# Patient Record
Sex: Male | Born: 1955 | Race: Black or African American | Hispanic: No | Marital: Married | Smoking: Former smoker
Health system: Southern US, Community
[De-identification: ages and names within clinical notes are randomized; demographics above are authoritative.]

## PROBLEM LIST (undated history)

## (undated) DIAGNOSIS — M1990 Unspecified osteoarthritis, unspecified site: Secondary | ICD-10-CM

## (undated) DIAGNOSIS — Z8719 Personal history of other diseases of the digestive system: Secondary | ICD-10-CM

## (undated) DIAGNOSIS — M549 Dorsalgia, unspecified: Secondary | ICD-10-CM

## (undated) DIAGNOSIS — K219 Gastro-esophageal reflux disease without esophagitis: Secondary | ICD-10-CM

## (undated) HISTORY — DX: Dorsalgia, unspecified: M54.9

## (undated) HISTORY — PX: PILONIDAL CYST EXCISION: SHX744

---

## 2014-06-02 DIAGNOSIS — M5440 Lumbago with sciatica, unspecified side: Secondary | ICD-10-CM | POA: Insufficient documentation

## 2014-06-02 DIAGNOSIS — G8929 Other chronic pain: Secondary | ICD-10-CM | POA: Insufficient documentation

## 2014-08-11 DIAGNOSIS — M47816 Spondylosis without myelopathy or radiculopathy, lumbar region: Secondary | ICD-10-CM | POA: Insufficient documentation

## 2014-08-11 DIAGNOSIS — M48061 Spinal stenosis, lumbar region without neurogenic claudication: Secondary | ICD-10-CM | POA: Insufficient documentation

## 2014-09-08 DIAGNOSIS — E782 Mixed hyperlipidemia: Secondary | ICD-10-CM | POA: Insufficient documentation

## 2014-09-08 DIAGNOSIS — R7301 Impaired fasting glucose: Secondary | ICD-10-CM | POA: Insufficient documentation

## 2014-09-18 DIAGNOSIS — M4726 Other spondylosis with radiculopathy, lumbar region: Secondary | ICD-10-CM | POA: Insufficient documentation

## 2015-11-22 NOTE — H&P (Signed)
TOTAL HIP ADMISSION H&P  Patient is admitted for right total hip arthroplasty, anterior approach.  Subjective:  Chief Complaint:    Right hip primary OA / pain  HPI: Casey Chase, 60 y.o. male, has a history of pain and functional disability in the right hip(s) due to arthritis and patient has failed non-surgical conservative treatments for greater than 12 weeks to include NSAID's and/or analgesics, corticosteriod injections and activity modification.  Onset of symptoms was gradual starting ~4  years ago with gradually worsening course since that time.The patient noted no past surgery on the right hip(s).  Patient currently rates pain in the right hip at 9 out of 10 with activity. Patient has night pain, worsening of pain with activity and weight bearing, trendelenberg gait, pain that interfers with activities of daily living and pain with passive range of motion. Patient has evidence of periarticular osteophytes and joint space narrowing by imaging studies. This condition presents safety issues increasing the risk of falls.   There is no current active infection.   Risks, benefits and expectations were discussed with the patient.  Risks including but not limited to the risk of anesthesia, blood clots, nerve damage, blood vessel damage, failure of the prosthesis, infection and up to and including death.  Patient understand the risks, benefits and expectations and wishes to proceed with surgery.   PCP: HURDLE, CAITLIN E, NP  D/C Plans:      Home   Post-op Meds:       No Rx given   Tranexamic Acid:      To be given - IV    Decadron:      Is to be given  FYI:     ASA  Norco     Past Medical History:  Diagnosis Date  . Arthritis   . GERD (gastroesophageal reflux disease)   . History of hiatal hernia     Past Surgical History:  Procedure Laterality Date  . PILONIDAL CYST EXCISION      No prescriptions prior to admission.   No Known Allergies   Social History  Substance Use Topics   . Smoking status: Former Games developer  . Smokeless tobacco: Never Used  . Alcohol use 7.2 oz/week    12 Cans of beer per week       Review of Systems  Constitutional: Negative.   HENT: Negative.   Eyes: Negative.   Respiratory: Negative.   Cardiovascular: Negative.   Gastrointestinal: Positive for heartburn.  Genitourinary: Positive for frequency.  Musculoskeletal: Positive for back pain and joint pain.  Skin: Negative.   Neurological: Negative.   Endo/Heme/Allergies: Negative.   Psychiatric/Behavioral: Negative.     Objective:  Physical Exam  Constitutional: He is oriented to person, place, and time. He appears well-developed.  HENT:  Head: Normocephalic.  Eyes: Pupils are equal, round, and reactive to light.  Neck: Neck supple. No JVD present. No tracheal deviation present. No thyromegaly present.  Cardiovascular: Normal rate, regular rhythm, normal heart sounds and intact distal pulses.   Respiratory: Effort normal and breath sounds normal. No respiratory distress. He has no wheezes.  GI: Soft. There is no tenderness. There is no guarding.  Musculoskeletal:       Right hip: He exhibits decreased range of motion, decreased strength, tenderness and bony tenderness. He exhibits no swelling, no deformity and no laceration.  Lymphadenopathy:    He has no cervical adenopathy.  Neurological: He is alert and oriented to person, place, and time.  Skin: Skin is warm  and dry.  Psychiatric: He has a normal mood and affect.      Imaging Review Plain radiographs demonstrate severe degenerative joint disease of the right hip(s). The bone quality appears to be good for age and reported activity level.  Assessment/Plan:  End stage arthritis, right hip(s)  The patient history, physical examination, clinical judgement of the provider and imaging studies are consistent with end stage degenerative joint disease of the right hip(s) and total hip arthroplasty is deemed medically necessary.  The treatment options including medical management, injection therapy, arthroscopy and arthroplasty were discussed at length. The risks and benefits of total hip arthroplasty were presented and reviewed. The risks due to aseptic loosening, infection, stiffness, dislocation/subluxation,  thromboembolic complications and other imponderables were discussed.  The patient acknowledged the explanation, agreed to proceed with the plan and consent was signed. Patient is being admitted for inpatient treatment for surgery, pain control, PT, OT, prophylactic antibiotics, VTE prophylaxis, progressive ambulation and ADL's and discharge planning.The patient is planning to be discharged home with home health services.      Anastasio AuerbachMatthew S. Mousa Prout   PA-C  12/03/2015, 7:50 AM

## 2015-11-23 NOTE — Patient Instructions (Signed)
Casey Chase  11/23/2015   Your procedure is scheduled on: 12/04/2015    [facility]  [facility]  [facility] to 3rd floor to  Short Stay Center at    1000 AM.  Call this number if you have problems the morning of surgery [phone]   Remember: ONLY 1 PERSON MAY GO WITH YOU TO SHORT STAY TO GET  READY MORNING OF YOUR SURGERY.  Do not eat food or drink liquids :After Midnight.     Take these medicines the morning of surgery with A SIP OF WATER:   DO NOT TAKE ANY DIABETIC MEDICATIONS DAY OF YOUR SURGERY                               You may not have any metal on your body including hair pins and              piercings  Do not wear jewelry,, lotions, powders or perfumes, deodorant                        Men may shave face and neck.   Do not bring valuables to the hospital. [facility] IS NOT             RESPONSIBLE   FOR VALUABLES.  Contacts, dentures or bridgework may not be worn into surgery.  Leave suitcase in the car. After surgery it may be brought to your room.         Special Instructions: N/A              Please read over the following fact sheets you were given: _____________________________________________________________________             [facility] - Preparing for [facility] Before surgery, you can play an important role.  Because skin is not sterile, your skin needs to be as free of germs as possible.  You can reduce the number of germs on your skin by washing with CHG (chlorahexidine gluconate) soap before surgery.  CHG is an antiseptic cleaner which kills germs and bonds with the skin to continue killing germs even after washing. Please DO NOT use if you have an allergy to CHG or antibacterial soaps.  If your skin becomes reddened/irritated stop using the CHG and inform your nurse when you arrive at Short Stay. Do not shave (including legs and underarms) for at least 48 hours prior to the first CHG shower.  You may  shave your face/neck. Please follow these instructions carefully:  1.  Shower with CHG Soap the night before surgery and the  morning of Surgery.  2.  If you choose to wash your hair, wash your hair first as usual with your  normal  shampoo.  3.  After you shampoo, rinse your hair and body thoroughly to remove the  shampoo.                           4.  Use CHG as you would any other liquid soap.  You can apply chg directly  to the skin and wash                       Gently with a scrungie or clean washcloth.  5.  Apply  the CHG Soap to your body ONLY FROM THE NECK DOWN.   Do not use on face/ open                           Wound or open sores. Avoid contact with eyes, ears mouth and genitals (private parts).                       Wash face,  Genitals (private parts) with your normal soap.             6.  Wash thoroughly, paying special attention to the area where your surgery  will be performed.  7.  Thoroughly rinse your body with warm water from the neck down.  8.  DO NOT shower/wash with your normal soap after using and rinsing off  the CHG Soap.                9.  Pat yourself dry with a clean towel.            10.  Wear clean pajamas.            11.  Place clean sheets on your bed the night of your first shower and do not  sleep with pets. Day of Surgery : Do not apply any lotions/deodorants the morning of surgery.  Please wear clean clothes to the hospital/surgery center.  FAILURE TO FOLLOW THESE INSTRUCTIONS MAY RESULT IN THE CANCELLATION OF YOUR SURGERY PATIENT SIGNATURE_________________________________  NURSE SIGNATURE__________________________________  ________________________________________________________________________  WHAT IS A BLOOD TRANSFUSION? Blood Transfusion Information  A transfusion is the replacement of blood or some of its parts. Blood is made up of multiple cells which provide different functions.  Red blood cells carry oxygen and are used for blood loss  replacement.  White blood cells fight against infection.  Platelets control bleeding.  Plasma helps clot blood.  Other blood products are available for specialized needs, such as hemophilia or other clotting disorders. BEFORE THE TRANSFUSION  Who gives blood for transfusions?   Healthy volunteers who are fully evaluated to make sure their blood is safe. This is blood bank blood. Transfusion therapy is the safest it has ever been in the practice of medicine. Before blood is taken from a donor, a complete history is taken to make sure that person has no history of diseases nor engages in risky social behavior (examples are intravenous drug use or sexual activity with multiple partners). The donor's travel history is screened to minimize risk of transmitting infections, such as malaria. The donated blood is tested for signs of infectious diseases, such as HIV and hepatitis. The blood is then tested to be sure it is compatible with you in order to minimize the chance of a transfusion reaction. If you or a relative donates blood, this is often done in anticipation of surgery and is not appropriate for emergency situations. It takes many days to process the donated blood. RISKS AND COMPLICATIONS Although transfusion therapy is very safe and saves many lives, the main dangers of transfusion include:   Getting an infectious disease.  Developing a transfusion reaction. This is an allergic reaction to something in the blood you were given. Every precaution is taken to prevent this. The decision to have a blood transfusion has been considered carefully by your caregiver before blood is given. Blood is not given unless the benefits outweigh the risks. AFTER THE TRANSFUSION  Right after receiving a blood  transfusion, you will usually feel much better and more energetic. This is especially true if your red blood cells have gotten low (anemic). The transfusion raises the level of the red blood cells which  carry oxygen, and this usually causes an energy increase.  The nurse administering the transfusion will monitor you carefully for complications. HOME CARE INSTRUCTIONS  No special instructions are needed after a transfusion. You may find your energy is better. Speak with your caregiver about any limitations on activity for underlying diseases you may have. SEEK MEDICAL CARE IF:   Your condition is not improving after your transfusion.  You develop redness or irritation at the intravenous (IV) site. SEEK IMMEDIATE MEDICAL CARE IF:  Any of the following symptoms occur over the next 12 hours:  Shaking chills.  You have a temperature by mouth above 102 F (38.9 C), not controlled by medicine.  Chest, back, or muscle pain.  People around you feel you are not acting correctly or are confused.  Shortness of breath or difficulty breathing.  Dizziness and fainting.  You get a rash or develop hives.  You have a decrease in urine output.  Your urine turns a dark color or changes to pink, red, or brown. Any of the following symptoms occur over the next 10 days:  You have a temperature by mouth above 102 F (38.9 C), not controlled by medicine.  Shortness of breath.  Weakness after normal activity.  The white part of the eye turns yellow (jaundice).  You have a decrease in the amount of urine or are urinating less often.  Your urine turns a dark color or changes to pink, red, or brown. Document Released: 02/15/2000 Document Revised: 05/12/2011 Document Reviewed: 10/04/2007 ExitCare Patient Information 2014 [place].  _______________________________________________________________________  Incentive Spirometer  An incentive spirometer is a tool that can help keep your lungs clear and active. This tool measures how well you are filling your lungs with each breath. Taking long deep breaths may help reverse or decrease the chance of developing breathing (pulmonary) problems  (especially infection) following:  A long period of time when you are unable to move or be active. BEFORE THE PROCEDURE   If the spirometer includes an indicator to show your best effort, your nurse or respiratory therapist will set it to a desired goal.  If possible, sit up straight or lean slightly forward. Try not to slouch.  Hold the incentive spirometer in an upright position. INSTRUCTIONS FOR USE  1. Sit on the edge of your bed if possible, or sit up as far as you can in bed or on a chair. 2. Hold the incentive spirometer in an upright position. 3. Breathe out normally. 4. Place the mouthpiece in your mouth and seal your lips tightly around it. 5. Breathe in slowly and as deeply as possible, raising the piston or the ball toward the top of the column. 6. Hold your breath for 3-5 seconds or for as long as possible. Allow the piston or ball to fall to the bottom of the column. 7. Remove the mouthpiece from your mouth and breathe out normally. 8. Rest for a few seconds and repeat Steps 1 through 7 at least 10 times every 1-2 hours when you are awake. Take your time and take a few normal breaths between deep breaths. 9. The spirometer may include an indicator to show your best effort. Use the indicator as a goal to work toward during each repetition. 10. After each set of 10 deep  breaths, practice coughing to be sure your lungs are clear. If you have an incision (the cut made at the time of surgery), support your incision when coughing by placing a pillow or rolled up towels firmly against it. Once you are able to get out of bed, walk around indoors and cough well. You may stop using the incentive spirometer when instructed by your caregiver.  RISKS AND COMPLICATIONS  Take your time so you do not get dizzy or light-headed.  If you are in pain, you may need to take or ask for pain medication before doing incentive spirometry. It is harder to take a deep breath if you are having  pain. AFTER USE  Rest and breathe slowly and easily.  It can be helpful to keep track of a log of your progress. Your caregiver can provide you with a simple table to help with this. If you are using the spirometer at home, follow these instructions: [place] IF:   You are having difficultly using the spirometer.  You have trouble using the spirometer as often as instructed.  Your pain medication is not giving enough relief while using the spirometer.  You develop fever of 100.5 F (38.1 C) or higher. SEEK IMMEDIATE MEDICAL CARE IF:   You cough up bloody sputum that had not been present before.  You develop fever of 102 F (38.9 C) or greater.  You develop worsening pain at or near the incision site. MAKE SURE YOU:   Understand these instructions.  Will watch your condition.  Will get help right away if you are not doing well or get worse. Document Released: 06/30/2006 Document Revised: 05/12/2011 Document Reviewed: 08/31/2006 [facility] Patient Information 2014 [place], [place].   ________________________________________________________________________

## 2015-11-27 ENCOUNTER — Encounter (HOSPITAL_COMMUNITY): Payer: Self-pay

## 2015-11-27 ENCOUNTER — Encounter (HOSPITAL_COMMUNITY)
Admission: RE | Admit: 2015-11-27 | Discharge: 2015-11-27 | Disposition: A | Discharge status: Home / Self Care | Payer: BLUE CROSS/BLUE SHIELD | Source: Ambulatory Visit | Attending: Orthopedic Surgery | Admitting: Orthopedic Surgery

## 2015-11-27 DIAGNOSIS — Z01818 Encounter for other preprocedural examination: Secondary | ICD-10-CM | POA: Diagnosis present

## 2015-11-27 HISTORY — DX: Unspecified osteoarthritis, unspecified site: M19.90

## 2015-11-27 HISTORY — DX: Gastro-esophageal reflux disease without esophagitis: K21.9

## 2015-11-27 HISTORY — DX: Personal history of other diseases of the digestive system: Z87.19

## 2015-11-27 LAB — CBC
HCT: 42.4 % (ref 39.0–52.0)
Hemoglobin: 13.8 g/dL (ref 13.0–17.0)
MCH: 29.4 pg (ref 26.0–34.0)
MCHC: 32.5 g/dL (ref 30.0–36.0)
MCV: 90.2 fL (ref 78.0–100.0)
Platelets: 313 10*3/uL (ref 150–400)
RBC: 4.7 MIL/uL (ref 4.22–5.81)
RDW: 14.5 % (ref 11.5–15.5)
WBC: 6 10*3/uL (ref 4.0–10.5)

## 2015-11-27 LAB — ABO/RH: ABO/RH(D): A POS

## 2015-11-27 LAB — SURGICAL PCR SCREEN
MRSA, PCR: NEGATIVE
STAPHYLOCOCCUS AUREUS: NEGATIVE

## 2015-12-04 ENCOUNTER — Encounter (HOSPITAL_COMMUNITY)
Admission: RE | Disposition: A | Discharge status: Home Health | Payer: Self-pay | Source: Ambulatory Visit | Attending: Orthopedic Surgery

## 2015-12-04 ENCOUNTER — Inpatient Hospital Stay (HOSPITAL_COMMUNITY): Payer: BLUE CROSS/BLUE SHIELD

## 2015-12-04 ENCOUNTER — Inpatient Hospital Stay (HOSPITAL_COMMUNITY): Payer: BLUE CROSS/BLUE SHIELD | Admitting: Certified Registered"

## 2015-12-04 ENCOUNTER — Encounter (HOSPITAL_COMMUNITY): Payer: Self-pay | Admitting: *Deleted

## 2015-12-04 ENCOUNTER — Inpatient Hospital Stay (HOSPITAL_COMMUNITY)
Admission: RE | Admit: 2015-12-04 | Discharge: 2015-12-05 | DRG: 470 | Disposition: A | Discharge status: Home Health | Payer: BLUE CROSS/BLUE SHIELD | Source: Ambulatory Visit | Attending: Orthopedic Surgery | Admitting: Orthopedic Surgery

## 2015-12-04 DIAGNOSIS — M1611 Unilateral primary osteoarthritis, right hip: Principal | ICD-10-CM | POA: Diagnosis present

## 2015-12-04 DIAGNOSIS — Z87891 Personal history of nicotine dependence: Secondary | ICD-10-CM | POA: Diagnosis not present

## 2015-12-04 DIAGNOSIS — E669 Obesity, unspecified: Secondary | ICD-10-CM | POA: Diagnosis present

## 2015-12-04 DIAGNOSIS — Z96649 Presence of unspecified artificial hip joint: Secondary | ICD-10-CM

## 2015-12-04 DIAGNOSIS — E663 Overweight: Secondary | ICD-10-CM | POA: Diagnosis present

## 2015-12-04 DIAGNOSIS — M25551 Pain in right hip: Secondary | ICD-10-CM | POA: Diagnosis present

## 2015-12-04 DIAGNOSIS — K219 Gastro-esophageal reflux disease without esophagitis: Secondary | ICD-10-CM | POA: Diagnosis present

## 2015-12-04 DIAGNOSIS — K449 Diaphragmatic hernia without obstruction or gangrene: Secondary | ICD-10-CM | POA: Diagnosis present

## 2015-12-04 DIAGNOSIS — Z6829 Body mass index (BMI) 29.0-29.9, adult: Secondary | ICD-10-CM | POA: Diagnosis not present

## 2015-12-04 HISTORY — PX: TOTAL HIP ARTHROPLASTY: SHX124

## 2015-12-04 LAB — TYPE AND SCREEN
ABO/RH(D): A POS
ANTIBODY SCREEN: NEGATIVE

## 2015-12-04 SURGERY — ARTHROPLASTY, HIP, TOTAL, ANTERIOR APPROACH
Anesthesia: Spinal | Site: Hip | Laterality: Right

## 2015-12-04 MED ORDER — LIDOCAINE 2% (20 MG/ML) 5 ML SYRINGE
INTRAMUSCULAR | Status: AC
  Filled 2015-12-04: qty 5

## 2015-12-04 MED ORDER — DEXAMETHASONE SODIUM PHOSPHATE 10 MG/ML IJ SOLN
INTRAMUSCULAR | Status: DC | PRN
  Administered 2015-12-04: 10 mg via INTRAVENOUS

## 2015-12-04 MED ORDER — DIPHENHYDRAMINE HCL 25 MG PO CAPS: 25.0000 mg | ORAL_CAPSULE | Freq: Four times a day (QID) | ORAL | Status: DC | PRN

## 2015-12-04 MED ORDER — METHOCARBAMOL 500 MG PO TABS
500.0000 mg | ORAL_TABLET | Freq: Four times a day (QID) | ORAL | Status: DC | PRN
  Administered 2015-12-05: 500 mg via ORAL
  Filled 2015-12-04: qty 1

## 2015-12-04 MED ORDER — BISACODYL 10 MG RE SUPP: 10.0000 mg | Freq: Every day | RECTAL | Status: DC | PRN

## 2015-12-04 MED ORDER — BUPIVACAINE IN DEXTROSE 0.75-8.25 % IT SOLN
INTRATHECAL | Status: DC | PRN
  Administered 2015-12-04: 2 mL via INTRATHECAL

## 2015-12-04 MED ORDER — CEFAZOLIN SODIUM-DEXTROSE 2-4 GM/100ML-% IV SOLN
2.0000 g | INTRAVENOUS | Status: AC
  Administered 2015-12-04: 2 g via INTRAVENOUS

## 2015-12-04 MED ORDER — METHOCARBAMOL 1000 MG/10ML IJ SOLN
500.0000 mg | Freq: Four times a day (QID) | INTRAVENOUS | Status: DC | PRN
  Administered 2015-12-04: 500 mg via INTRAVENOUS
  Filled 2015-12-04: qty 5
  Filled 2015-12-04: qty 550

## 2015-12-04 MED ORDER — PROPOFOL 10 MG/ML IV BOLUS
INTRAVENOUS | Status: DC | PRN
  Administered 2015-12-04: 20 mg via INTRAVENOUS

## 2015-12-04 MED ORDER — DOCUSATE SODIUM 100 MG PO CAPS
100.0000 mg | ORAL_CAPSULE | Freq: Two times a day (BID) | ORAL | Status: DC
  Administered 2015-12-04 – 2015-12-05 (×2): 100 mg via ORAL
  Filled 2015-12-04 (×2): qty 1

## 2015-12-04 MED ORDER — DEXAMETHASONE SODIUM PHOSPHATE 10 MG/ML IJ SOLN: 10.0000 mg | Freq: Once | INTRAMUSCULAR | Status: DC

## 2015-12-04 MED ORDER — PHENYLEPHRINE 40 MCG/ML (10ML) SYRINGE FOR IV PUSH (FOR BLOOD PRESSURE SUPPORT)
PREFILLED_SYRINGE | INTRAVENOUS | Status: DC | PRN
  Administered 2015-12-04 (×5): 80 ug via INTRAVENOUS

## 2015-12-04 MED ORDER — METOCLOPRAMIDE HCL 5 MG PO TABS: 5.0000 mg | ORAL_TABLET | Freq: Three times a day (TID) | ORAL | Status: DC | PRN

## 2015-12-04 MED ORDER — PROPOFOL 10 MG/ML IV BOLUS
INTRAVENOUS | Status: AC
  Filled 2015-12-04: qty 20

## 2015-12-04 MED ORDER — PROPOFOL 10 MG/ML IV BOLUS
INTRAVENOUS | Status: AC
  Filled 2015-12-04: qty 40

## 2015-12-04 MED ORDER — CEFAZOLIN SODIUM-DEXTROSE 2-4 GM/100ML-% IV SOLN: 2.0000 g | INTRAVENOUS | Status: DC

## 2015-12-04 MED ORDER — ONDANSETRON HCL 4 MG PO TABS: 4.0000 mg | ORAL_TABLET | Freq: Four times a day (QID) | ORAL | Status: DC | PRN

## 2015-12-04 MED ORDER — MENTHOL 3 MG MT LOZG: 1.0000 | LOZENGE | OROMUCOSAL | Status: DC | PRN

## 2015-12-04 MED ORDER — METOCLOPRAMIDE HCL 5 MG/ML IJ SOLN
5.0000 mg | Freq: Three times a day (TID) | INTRAMUSCULAR | Status: DC | PRN
Start: 2015-12-04 — End: 2015-12-05
  Administered 2015-12-05: 10 mg via INTRAVENOUS
  Filled 2015-12-04: qty 2

## 2015-12-04 MED ORDER — SODIUM CHLORIDE 0.9 % IV SOLN
100.0000 mL/h | INTRAVENOUS | Status: DC
  Administered 2015-12-04: 100 mL/h via INTRAVENOUS
  Filled 2015-12-04 (×4): qty 1000

## 2015-12-04 MED ORDER — ALUM & MAG HYDROXIDE-SIMETH 200-200-20 MG/5ML PO SUSP: 30.0000 mL | ORAL | Status: DC | PRN

## 2015-12-04 MED ORDER — TRANEXAMIC ACID 1000 MG/10ML IV SOLN
1000.0000 mg | INTRAVENOUS | Status: AC
  Administered 2015-12-04: 1000 mg via INTRAVENOUS
  Filled 2015-12-04: qty 10

## 2015-12-04 MED ORDER — ONDANSETRON HCL 4 MG/2ML IJ SOLN
4.0000 mg | Freq: Four times a day (QID) | INTRAMUSCULAR | Status: DC | PRN
  Administered 2015-12-05: 4 mg via INTRAVENOUS
  Filled 2015-12-04: qty 2

## 2015-12-04 MED ORDER — HYDROMORPHONE HCL 1 MG/ML IJ SOLN: 0.2500 mg | INTRAMUSCULAR | Status: DC | PRN

## 2015-12-04 MED ORDER — CEFAZOLIN SODIUM-DEXTROSE 2-4 GM/100ML-% IV SOLN
2.0000 g | Freq: Four times a day (QID) | INTRAVENOUS | Status: AC
  Administered 2015-12-04 – 2015-12-05 (×2): 2 g via INTRAVENOUS
  Filled 2015-12-04 (×2): qty 100

## 2015-12-04 MED ORDER — HYDROMORPHONE HCL 1 MG/ML IJ SOLN
0.5000 mg | INTRAMUSCULAR | Status: DC | PRN
  Administered 2015-12-04: 0.5 mg via INTRAVENOUS
  Filled 2015-12-04: qty 1

## 2015-12-04 MED ORDER — POLYETHYLENE GLYCOL 3350 17 G PO PACK
17.0000 g | PACK | Freq: Two times a day (BID) | ORAL | Status: DC
  Administered 2015-12-04: 17 g via ORAL
  Filled 2015-12-04 (×2): qty 1

## 2015-12-04 MED ORDER — LACTATED RINGERS IV SOLN
INTRAVENOUS | Status: DC
  Administered 2015-12-04 (×2): via INTRAVENOUS

## 2015-12-04 MED ORDER — CELECOXIB 200 MG PO CAPS
200.0000 mg | ORAL_CAPSULE | Freq: Two times a day (BID) | ORAL | Status: DC
  Administered 2015-12-04 – 2015-12-05 (×2): 200 mg via ORAL
  Filled 2015-12-04 (×2): qty 1

## 2015-12-04 MED ORDER — MAGNESIUM CITRATE PO SOLN: 1.0000 | Freq: Once | ORAL | Status: DC | PRN

## 2015-12-04 MED ORDER — HYDROCODONE-ACETAMINOPHEN 7.5-325 MG PO TABS
1.0000 | ORAL_TABLET | ORAL | Status: DC
  Administered 2015-12-04 (×2): 1 via ORAL
  Administered 2015-12-05 (×3): 2 via ORAL
  Administered 2015-12-05: 1 via ORAL
  Filled 2015-12-04: qty 1
  Filled 2015-12-04: qty 2
  Filled 2015-12-04: qty 1
  Filled 2015-12-04: qty 2
  Filled 2015-12-04: qty 1
  Filled 2015-12-04: qty 2

## 2015-12-04 MED ORDER — SODIUM CHLORIDE 0.9 % IR SOLN
Status: DC | PRN
  Administered 2015-12-04: 1000 mL

## 2015-12-04 MED ORDER — CEFAZOLIN SODIUM-DEXTROSE 2-4 GM/100ML-% IV SOLN
INTRAVENOUS | Status: AC
  Filled 2015-12-04: qty 100

## 2015-12-04 MED ORDER — PHENOL 1.4 % MT LIQD
1.0000 | OROMUCOSAL | Status: DC | PRN
  Filled 2015-12-04: qty 177

## 2015-12-04 MED ORDER — ASPIRIN 81 MG PO CHEW
81.0000 mg | CHEWABLE_TABLET | Freq: Two times a day (BID) | ORAL | Status: DC
  Administered 2015-12-04 – 2015-12-05 (×2): 81 mg via ORAL
  Filled 2015-12-04 (×2): qty 1

## 2015-12-04 MED ORDER — EPHEDRINE SULFATE-NACL 50-0.9 MG/10ML-% IV SOSY
PREFILLED_SYRINGE | INTRAVENOUS | Status: DC | PRN
  Administered 2015-12-04: 15 mg via INTRAVENOUS

## 2015-12-04 MED ORDER — FENTANYL CITRATE (PF) 100 MCG/2ML IJ SOLN
INTRAMUSCULAR | Status: DC | PRN
Start: 2015-12-04 — End: 2015-12-04
  Administered 2015-12-04 (×2): 25 ug via INTRAVENOUS
  Administered 2015-12-04: 50 ug via INTRAVENOUS

## 2015-12-04 MED ORDER — MIDAZOLAM HCL 2 MG/2ML IJ SOLN
INTRAMUSCULAR | Status: AC
  Filled 2015-12-04: qty 2

## 2015-12-04 MED ORDER — ONDANSETRON HCL 4 MG/2ML IJ SOLN
INTRAMUSCULAR | Status: DC | PRN
  Administered 2015-12-04: 4 mg via INTRAVENOUS

## 2015-12-04 MED ORDER — PROPOFOL 500 MG/50ML IV EMUL
INTRAVENOUS | Status: DC | PRN
  Administered 2015-12-04: 75 ug/kg/min via INTRAVENOUS

## 2015-12-04 MED ORDER — MIDAZOLAM HCL 5 MG/5ML IJ SOLN
INTRAMUSCULAR | Status: DC | PRN
  Administered 2015-12-04: 2 mg via INTRAVENOUS

## 2015-12-04 MED ORDER — PHENYLEPHRINE 40 MCG/ML (10ML) SYRINGE FOR IV PUSH (FOR BLOOD PRESSURE SUPPORT)
PREFILLED_SYRINGE | INTRAVENOUS | Status: AC
  Filled 2015-12-04: qty 10

## 2015-12-04 MED ORDER — FERROUS SULFATE 325 (65 FE) MG PO TABS: 325.0000 mg | ORAL_TABLET | Freq: Three times a day (TID) | ORAL | Status: DC

## 2015-12-04 MED ORDER — EPHEDRINE 5 MG/ML INJ
INTRAVENOUS | Status: AC
  Filled 2015-12-04: qty 10

## 2015-12-04 MED ORDER — FENTANYL CITRATE (PF) 100 MCG/2ML IJ SOLN
INTRAMUSCULAR | Status: AC
Start: 2015-12-04 — End: 2015-12-04
  Filled 2015-12-04: qty 2

## 2015-12-04 MED ORDER — PROMETHAZINE HCL 25 MG/ML IJ SOLN: 6.2500 mg | INTRAMUSCULAR | Status: DC | PRN

## 2015-12-04 MED ORDER — DEXAMETHASONE SODIUM PHOSPHATE 10 MG/ML IJ SOLN
INTRAMUSCULAR | Status: AC
  Filled 2015-12-04: qty 1

## 2015-12-04 MED ORDER — LIDOCAINE 2% (20 MG/ML) 5 ML SYRINGE
INTRAMUSCULAR | Status: DC | PRN
  Administered 2015-12-04: 20 mg via INTRAVENOUS

## 2015-12-04 MED ORDER — CHLORHEXIDINE GLUCONATE 4 % EX LIQD: 60.0000 mL | Freq: Once | CUTANEOUS | Status: DC

## 2015-12-04 MED ORDER — ONDANSETRON HCL 4 MG/2ML IJ SOLN
INTRAMUSCULAR | Status: AC
  Filled 2015-12-04: qty 2

## 2015-12-04 SURGICAL SUPPLY — 35 items
BAG DECANTER FOR FLEXI CONT (MISCELLANEOUS) IMPLANT
BAG ZIPLOCK 12X15 (MISCELLANEOUS) IMPLANT
CAPT HIP TOTAL 2 ×3 IMPLANT
CLOTH BEACON ORANGE TIMEOUT ST (SAFETY) ×3 IMPLANT
COVER PERINEAL POST (MISCELLANEOUS) ×3 IMPLANT
DERMABOND ADVANCED (GAUZE/BANDAGES/DRESSINGS) ×2
DERMABOND ADVANCED .7 DNX12 (GAUZE/BANDAGES/DRESSINGS) ×1 IMPLANT
DRAPE STERI IOBAN 125X83 (DRAPES) ×3 IMPLANT
DRAPE U-SHAPE 47X51 STRL (DRAPES) ×6 IMPLANT
DRESSING AQUACEL AG SP 3.5X10 (GAUZE/BANDAGES/DRESSINGS) ×1 IMPLANT
DRSG AQUACEL AG SP 3.5X10 (GAUZE/BANDAGES/DRESSINGS) ×3
DURAPREP 26ML APPLICATOR (WOUND CARE) ×3 IMPLANT
ELECT REM PT RETURN 15FT ADLT (MISCELLANEOUS) IMPLANT
ELECT REM PT RETURN 9FT ADLT (ELECTROSURGICAL) ×3
ELECTRODE REM PT RTRN 9FT ADLT (ELECTROSURGICAL) ×1 IMPLANT
GLOVE BIOGEL M STRL SZ7.5 (GLOVE) ×3 IMPLANT
GLOVE BIOGEL PI IND STRL 7.5 (GLOVE) ×1 IMPLANT
GLOVE BIOGEL PI IND STRL 8.5 (GLOVE) IMPLANT
GLOVE BIOGEL PI INDICATOR 7.5 (GLOVE) ×2
GLOVE BIOGEL PI INDICATOR 8.5 (GLOVE)
GLOVE ECLIPSE 8.0 STRL XLNG CF (GLOVE) ×6 IMPLANT
GLOVE ORTHO TXT STRL SZ7.5 (GLOVE) ×6 IMPLANT
GOWN STRL REUS W/TWL LRG LVL3 (GOWN DISPOSABLE) ×3 IMPLANT
GOWN STRL REUS W/TWL XL LVL3 (GOWN DISPOSABLE) ×6 IMPLANT
HOLDER FOLEY CATH W/STRAP (MISCELLANEOUS) ×3 IMPLANT
PACK ANTERIOR HIP CUSTOM (KITS) ×3 IMPLANT
SAW OSC TIP CART 19.5X105X1.3 (SAW) ×3 IMPLANT
SUT MNCRL AB 4-0 PS2 18 (SUTURE) ×3 IMPLANT
SUT VIC AB 1 CT1 36 (SUTURE) ×9 IMPLANT
SUT VIC AB 2-0 CT1 27 (SUTURE) ×4
SUT VIC AB 2-0 CT1 TAPERPNT 27 (SUTURE) ×2 IMPLANT
SUT VLOC 180 0 24IN GS25 (SUTURE) ×3 IMPLANT
TRAY FOLEY W/METER SILVER 16FR (SET/KITS/TRAYS/PACK) ×3 IMPLANT
WATER STERILE IRR 1500ML POUR (IV SOLUTION) ×3 IMPLANT
YANKAUER SUCT BULB TIP 10FT TU (MISCELLANEOUS) ×3 IMPLANT

## 2015-12-04 NOTE — Op Note (Signed)
NAME:  Casey Chase                ACCOUNT NO.: [id number]      MEDICAL RECORD NO.: [id number]      FACILITY:  [facility]      PHYSICIAN:  Durene Romans D  DATE OF BIRTH:  1955-05-28     DATE OF PROCEDURE:  12/04/2015                                 OPERATIVE REPORT         PREOPERATIVE DIAGNOSIS: Right  hip osteoarthritis.      POSTOPERATIVE DIAGNOSIS:  Right hip osteoarthritis.      PROCEDURE:  Right total hip replacement through an anterior approach   utilizing DePuy THR system, component size 52mm pinnacle cup, a size 36+4 neutral   Altrex liner, a size 4 Hi Tri Lock stem with a 36+1.5 delta ceramic   ball.      SURGEON:  Madlyn Frankel. Charlann Boxer, M.D.      ASSISTANT:  Skip Mayer, PA-C     ANESTHESIA:  Spinal.      SPECIMENS:  None.      COMPLICATIONS:  None.      BLOOD LOSS:  300 cc     DRAINS:  None.      INDICATION OF THE PROCEDURE:  Casey Chase is a 60 y.o. male who had   presented to office for evaluation of right hip pain.  Radiographs revealed   progressive degenerative changes with bone-on-bone   articulation to the  hip joint.  The patient had painful limited range of   motion significantly affecting their overall quality of life.  The patient was failing to    respond to conservative measures, and at this point was ready   to proceed with more definitive measures.  The patient has noted progressive   degenerative changes in his hip, progressive problems and dysfunction   with regarding the hip prior to surgery.  Consent was obtained for   benefit of pain relief.  Specific risk of infection, DVT, component   failure, dislocation, need for revision surgery, as well discussion of   the anterior versus posterior approach were reviewed.  Consent was   obtained for benefit of anterior pain relief through an anterior   approach.      PROCEDURE IN DETAIL:  The patient was brought to operative theater.   Once adequate anesthesia, preoperative  antibiotics, 2gm of Ancef, 1 gm of Tranexamic Acid, and 10 mg of Decadron administered.   The patient was positioned supine on the OSI Hanna table.  Once adequate   padding of boney process was carried out, we had predraped out the hip, and  used fluoroscopy to confirm orientation of the pelvis and position.      The right hip was then prepped and draped from proximal iliac crest to   mid thigh with shower curtain technique.      Time-out was performed identifying the patient, planned procedure, and   extremity.     An incision was then made 2 cm distal and lateral to the   anterior superior iliac spine extending over the orientation of the   tensor fascia lata muscle and sharp dissection was carried down to the   fascia of the muscle and protractor placed in the soft tissues.      The fascia was then incised.  The muscle belly was identified and swept   laterally and retractor placed along the superior neck.  Following   cauterization of the circumflex vessels and removing some pericapsular   fat, a second cobra retractor was placed on the inferior neck.  A third   retractor was placed on the anterior acetabulum after elevating the   anterior rectus.  A L-capsulotomy was along the line of the   superior neck to the trochanteric fossa, then extended proximally and   distally.  Tag sutures were placed and the retractors were then placed   intracapsular.  We then identified the trochanteric fossa and   orientation of my neck cut, confirmed this radiographically   and then made a neck osteotomy with the femur on traction.  The femoral   head was removed without difficulty or complication.  Traction was let   off and retractors were placed posterior and anterior around the   acetabulum.      The labrum and foveal tissue were debrided.  I began reaming with a 45mm   reamer and reamed up to 51mm reamer with good bony bed preparation and a 52mm   cup was chosen.  The final 52mm Pinnacle cup  was then impacted under fluoroscopy  to confirm the depth of penetration and orientation with respect to   abduction.  A screw was placed followed by the hole eliminator.  The final   36+4 neutral Altrex liner was impacted with good visualized rim fit.  The cup was positioned anatomically within the acetabular portion of the pelvis.      At this point, the femur was rolled at 80 degrees.  Further capsule was   released off the inferior aspect of the femoral neck.  I then   released the superior capsule proximally.  The hook was placed laterally   along the femur and elevated manually and held in position with the bed   hook.  The leg was then extended and adducted with the leg rolled to 100   degrees of external rotation.  Once the proximal femur was fully   exposed, I used a box osteotome to set orientation.  I then began   broaching with the starting chili pepper broach and passed this by hand and then broached up to 4.  With the 4 broach in place I chose a high offset neck and did several trial reductions.  The offset was appropriate, leg lengths   appeared to be equal, confirmed radiographically.   Given these findings, I went ahead and dislocated the hip, repositioned all   retractors and positioned the right hip in the extended and abducted position.  The final 4 Hi Tri Lock stem was   chosen and it was impacted down to the level of neck cut.  Based on this   and the trial reduction, a 36+1.5 delta ceramic ball was chosen and   impacted onto a clean and dry trunnion, and the hip was reduced.  The   hip had been irrigated throughout the case again at this point.  I did   reapproximate the superior capsular leaflet to the anterior leaflet   using #1 Vicryl.  The fascia of the   tensor fascia lata muscle was then reapproximated using #1 Vicryl and #0 V-lock sutures.  The   remaining wound was closed with 2-0 Vicryl and running 4-0 Monocryl.   The hip was cleaned, dried, and dressed  sterilely using Dermabond and   Aquacel dressing.  He was then brought   to recovery room in stable condition tolerating the procedure well.    Leilani Able, PA-C was present for the entirety of the case involved from   preoperative positioning, perioperative retractor management, general   facilitation of the case, as well as primary wound closure as assistant.            Madlyn Frankel Charlann Boxer, M.D.        12/04/2015 2:03 PM

## 2015-12-04 NOTE — Interval H&P Note (Signed)
History and Physical Interval Note:  12/04/2015 11:10 AM  Casey Chase  has presented today for surgery, with the diagnosis of right hip osteoarthritis  The various methods of treatment have been discussed with the patient and family. After consideration of risks, benefits and other options for treatment, the patient has consented to  Procedure(s): RIGHT TOTAL HIP ARTHROPLASTY ANTERIOR APPROACH (Right) as a surgical intervention .  The patient's history has been reviewed, patient examined, no change in status, stable for surgery.  I have reviewed the patient's chart and labs.  Questions were answered to the patient's satisfaction.     Shelda PalLIN,Dezyre Hoefer D

## 2015-12-04 NOTE — Anesthesia Procedure Notes (Signed)
Spinal  Patient location during procedure: OR Start time: 12/04/2015 12:33 PM End time: 12/04/2015 12:36 PM Staffing Anesthesiologist: Franne Grip Resident/CRNA: Lajuana Carry E Performed: resident/CRNA  Preanesthetic Checklist Completed: patient identified, site marked, surgical consent, pre-op evaluation, timeout performed, IV checked, risks and benefits discussed and monitors and equipment checked Spinal Block Patient position: sitting Prep: Betadine Patient monitoring: heart rate, continuous pulse ox and blood pressure Approach: midline Location: L3-4 Injection technique: single-shot Needle Needle type: Pencan  Needle gauge: 24 G Needle length: 9 cm Additional Notes Kit expiration checked, Clear CSF, neg heme, neg paresthesia first attempt. Tol well.

## 2015-12-04 NOTE — Anesthesia Preprocedure Evaluation (Addendum)
Anesthesia Evaluation  Patient identified by MRN, date of birth, ID band Patient awake    Reviewed: Allergy & Precautions, NPO status , Patient's Chart, lab work & pertinent test results  Airway Mallampati: II  TM Distance: >3 FB Neck ROM: Full    Dental no notable dental hx.    Pulmonary former smoker,    Pulmonary exam normal breath sounds clear to auscultation       Cardiovascular negative cardio ROS Normal cardiovascular exam Rhythm:Regular Rate:Normal     Neuro/Psych negative neurological ROS  negative psych ROS   GI/Hepatic Neg liver ROS, hiatal hernia, GERD  ,  Endo/Other  negative endocrine ROS  Renal/GU negative Renal ROS  negative genitourinary   Musculoskeletal negative musculoskeletal ROS (+)   Abdominal (+) + obese,   Peds negative pediatric ROS (+)  Hematology negative hematology ROS (+)   Anesthesia Other Findings   Reproductive/Obstetrics negative OB ROS                             Anesthesia Physical Anesthesia Plan  ASA: II  Anesthesia Plan: Spinal   Post-op Pain Management:    Induction: Intravenous  Airway Management Planned: Natural Airway  Additional Equipment:   Intra-op Plan:   Post-operative Plan:   Informed Consent: I have reviewed the patients History and Physical, chart, labs and discussed the procedure including the risks, benefits and alternatives for the proposed anesthesia with the patient or authorized representative who has indicated his/her understanding and acceptance.   Dental advisory given  Plan Discussed with: CRNA  Anesthesia Plan Comments: (Discussed risks and benefits of and differences between spinal and general. Discussed risks of spinal including headache, backache, failure, bleeding and hematoma, infection, and nerve damage. Patient consents to spinal. Questions answered. Coagulation studies and platelet count acceptable.)        Anesthesia Quick Evaluation

## 2015-12-04 NOTE — Anesthesia Postprocedure Evaluation (Signed)
Anesthesia Post Note  Patient: Minna Merrittsustin Setzler  Procedure(s) Performed: Procedure(s) (LRB): RIGHT TOTAL HIP ARTHROPLASTY ANTERIOR APPROACH (Right)  Patient location during evaluation: PACU Anesthesia Type: Spinal Level of consciousness: oriented and awake and alert Pain management: pain level controlled Vital Signs Assessment: post-procedure vital signs reviewed and stable Respiratory status: spontaneous breathing, respiratory function stable and patient connected to nasal cannula oxygen Cardiovascular status: blood pressure returned to baseline and stable Postop Assessment: no headache, no backache, spinal receding and patient able to bend at knees Anesthetic complications: no    Last Vitals:  Vitals:   12/04/15 1620 12/04/15 1642  BP: 117/66 117/66  Pulse: 64 64  Resp: 13 14  Temp: 36.6 C 36.6 C    Last Pain:  Vitals:   12/04/15 1642  TempSrc: Oral  PainSc:                  Kandie Keiper J

## 2015-12-04 NOTE — Transfer of Care (Signed)
Immediate Anesthesia Transfer of Care Note  Patient: Casey Chase  Procedure(s) Performed: Procedure(s): RIGHT TOTAL HIP ARTHROPLASTY ANTERIOR APPROACH (Right)  Patient Location: PACU  Anesthesia Type:Spinal  Level of Consciousness:  sedated, patient cooperative and responds to stimulation  Airway & Oxygen Therapy:Patient Spontanous Breathing and Patient connected to face mask oxgen  Post-op Assessment:  Report given to PACU RN and Post -op Vital signs reviewed and stable  Post vital signs:  Reviewed and stable  Last Vitals:  Vitals:   12/04/15 0951  BP: (!) 144/90  Pulse: 83  Resp: 16  Temp: 37 C    Complications: No apparent anesthesia complications

## 2015-12-05 ENCOUNTER — Encounter (HOSPITAL_COMMUNITY): Payer: Self-pay | Admitting: Orthopedic Surgery

## 2015-12-05 DIAGNOSIS — E663 Overweight: Secondary | ICD-10-CM | POA: Diagnosis present

## 2015-12-05 LAB — CBC
HCT: 36 % — ABNORMAL LOW (ref 39.0–52.0)
Hemoglobin: 11.9 g/dL — ABNORMAL LOW (ref 13.0–17.0)
MCH: 29.5 pg (ref 26.0–34.0)
MCHC: 33.1 g/dL (ref 30.0–36.0)
MCV: 89.3 fL (ref 78.0–100.0)
PLATELETS: 271 10*3/uL (ref 150–400)
RBC: 4.03 MIL/uL — AB (ref 4.22–5.81)
RDW: 14.2 % (ref 11.5–15.5)
WBC: 11.6 10*3/uL — ABNORMAL HIGH (ref 4.0–10.5)

## 2015-12-05 LAB — BASIC METABOLIC PANEL
Anion gap: 5 (ref 5–15)
BUN: 12 mg/dL (ref 6–20)
CALCIUM: 8.9 mg/dL (ref 8.9–10.3)
CO2: 28 mmol/L (ref 22–32)
CREATININE: 0.93 mg/dL (ref 0.61–1.24)
Chloride: 106 mmol/L (ref 101–111)
GFR calc Af Amer: >60 mL/min (ref >60)
Glucose, Bld: 129 mg/dL — ABNORMAL HIGH (ref 65–99)
POTASSIUM: 4.2 mmol/L (ref 3.5–5.1)
Sodium: 139 mmol/L (ref 135–145)

## 2015-12-05 MED ORDER — METHOCARBAMOL 500 MG PO TABS: 500.0000 mg | ORAL_TABLET | Freq: Four times a day (QID) | ORAL | 0 refills | Status: DC | PRN

## 2015-12-05 MED ORDER — FERROUS SULFATE 325 (65 FE) MG PO TABS: 325.0000 mg | ORAL_TABLET | Freq: Three times a day (TID) | ORAL | 3 refills | Status: DC

## 2015-12-05 MED ORDER — HYDROCODONE-ACETAMINOPHEN 7.5-325 MG PO TABS: 1.0000 | ORAL_TABLET | ORAL | 0 refills | Status: DC | PRN

## 2015-12-05 MED ORDER — DOCUSATE SODIUM 100 MG PO CAPS: 100.0000 mg | ORAL_CAPSULE | Freq: Two times a day (BID) | ORAL | 0 refills | Status: DC

## 2015-12-05 MED ORDER — ASPIRIN 81 MG PO CHEW: 81.0000 mg | CHEWABLE_TABLET | Freq: Two times a day (BID) | ORAL | 0 refills | Status: AC

## 2015-12-05 MED ORDER — POLYETHYLENE GLYCOL 3350 17 G PO PACK: 17.0000 g | PACK | Freq: Two times a day (BID) | ORAL | 0 refills | Status: DC

## 2015-12-05 MED ORDER — PROMETHAZINE HCL 12.5 MG PO TABS: 12.5000 mg | ORAL_TABLET | Freq: Four times a day (QID) | ORAL | 0 refills | Status: DC | PRN

## 2015-12-05 NOTE — Progress Notes (Signed)
     Subjective: 1 Day Post-Op Procedure(s) (LRB): RIGHT TOTAL HIP ARTHROPLASTY ANTERIOR APPROACH (Right)   Patient reports pain as mild, pain controlled. No events throughout the night.  States he has been up and on the bedside commode.  He has been passing gas, but no BM yet.  Ready to work with PT.  Ready to be discharged home, if he does well with PT.  Objective:   VITALS:   Vitals:   12/05/15 0035 12/05/15 0505  BP: 130/68 (!) 115/56  Pulse: 69 81  Resp: 20 20  Temp: 99.1 F (37.3 C) 98.5 F (36.9 C)    Dorsiflexion/Plantar flexion intact Incision: dressing C/D/I No cellulitis present Compartment soft  LABS  Recent Labs  12/05/15 0431  HGB 11.9*  HCT 36.0*  WBC 11.6*  PLT 271     Recent Labs  12/05/15 0431  NA 139  K 4.2  BUN 12  CREATININE 0.93  GLUCOSE 129*     Assessment/Plan: 1 Day Post-Op Procedure(s) (LRB): RIGHT TOTAL HIP ARTHROPLASTY ANTERIOR APPROACH (Right) Foley cath d/c'ed Advance diet Up with therapy D/C IV fluids Discharge home with home health [facility]. Follow up with OLIN,Jos Cygan D in 2 weeks.  Contact information:  [facility] [address], [address] [place] [id number] [phone]       Overweight (BMI 25-29.9) Estimated body mass index is 29.91 kg/m as calculated from the following:   Height as of this encounter: 5\' 7"  (1.702 m).   Weight as of this encounter: 86.6 kg (191 lb). Patient also counseled that weight may inhibit the healing process Patient counseled that losing weight will help with future health issues        Anastasio AuerbachMatthew S. Azul Brumett   PAC  12/05/2015, 7:17 AM

## 2015-12-05 NOTE — Progress Notes (Signed)
Physical Therapy Treatment Patient Details Name: Casey Chase MRN: [id number] DOB: [birth year] Today's Date: 12/05/2015    History of Present Illness s/p R DA THA    PT Comments    Patient is progressing well. Ready for DC. Practiced steps w/ wife. To perform exercises tonight and walk every hour til bedtime.  Follow Up Recommendations  Home health PT     Equipment Recommendations  None recommended by PT    Recommendations for Other Services       Precautions / Restrictions Precautions Precautions: Fall    Mobility  Bed Mobility Overal bed mobility: Needs Assistance Bed Mobility: Supine to Sit     Supine to sit: Supervision     General bed mobility comments: in recliner  Transfers Overall transfer level: Needs assistance Equipment used: Rolling walker (2 wheeled) Transfers: Sit to/from Stand Sit to Stand: Supervision         General transfer comment: for safety; cues for UE placement and sequence, look ahead  Ambulation/Gait Ambulation/Gait assistance: Supervision Ambulation Distance (Feet): 60 Feet Assistive device: Rolling walker (2 wheeled) Gait Pattern/deviations: Step-through pattern;Step-to pattern     General Gait Details: cues for posture   Stairs Stairs: Yes Stairs assistance: Min assist Stair Management: Backwards;With walker;No rails Number of Stairs: 2 General stair comments: practiced x 2 with wife  Wheelchair Mobility    Modified Rankin (Stroke Patients Only)       Balance                                    Cognition Arousal/Alertness: Awake/alert                          Exercises    General Comments        Pertinent Vitals/Pain Pain Assessment: 0-10 Pain Score: 5  Faces Pain Scale: Hurts little more Pain Location: R hip Pain Descriptors / Indicators: Discomfort Pain Intervention(s): Monitored during session;Ice applied    Home Living Family/patient expects to be discharged to::  Private residence     Type of Home: House Home Access: Stairs to enter Entrance Stairs-Rails: None Home Layout: One level Home Equipment: Environmental consultantWalker - 2 wheels;Crutches      Prior Function            PT Goals (current goals can now be found in the care plan section) Acute Rehab PT Goals Patient Stated Goal: go home PT Goal Formulation: With patient/family Time For Goal Achievement: 12/07/15 Potential to Achieve Goals: Good Progress towards PT goals: Progressing toward goals    Frequency    7X/week      PT Plan Current plan remains appropriate    Co-evaluation             End of Session Equipment Utilized During Treatment: Gait belt Activity Tolerance: Patient tolerated treatment well Patient left: in chair;with call bell/phone within reach;with family/visitor present     Time: 1356-1406 PT Time Calculation (min) (ACUTE ONLY): 10 min  Charges:  $Gait Training: 8-22 mins                    G Codes:      Rada HayHill, Casey Chase 12/05/2015, 2:09 PM

## 2015-12-05 NOTE — Care Management Note (Signed)
Case Management Note  Patient Details  Name: Casey Chase MRN: [id number] Date of Birth: 30-May-1955  Subjective/Objective:                  RIGHT TOTAL HIP ARTHROPLASTY ANTERIOR APPROACH (Right)  Action/Plan: Discharge planning Expected Discharge Date:  12/05/15               Expected Discharge Plan:  [place]  In-House Referral:     Discharge planning Services  CM Consult  Post Acute Care Choice:  Home Health Choice offered to:  Patient  DME Arranged:  3-N-1 DME Agency:  [place]:  PT [place] Agency:  [facility]  Status of Service:  Completed, signed off  If discussed at [employer] of Stay Meetings, dates discussed:    Additional Comments: CM met with pt in room to offer choice of home health agency.  Pt chooses [facility] to render HHPT.  Referral given to [facility] rep, Tim.  CM notified [place] DME rep, Reggie to please deliver the 3n1 to room prior to discharge.  No other CM needs were communicated. Dellie Catholic, RN 12/05/2015, 11:13 AM

## 2015-12-05 NOTE — Evaluation (Signed)
Occupational Therapy Evaluation Patient Details Name: Hilman Kissling MRN: [id number] DOB: [birth year] Today's Date: 12/05/2015    History of Present Illness s/p R DA THA   Clinical Impression   This 60 year old man was admitted for the above sx. All education was completed.  No further OT is needed this time    Follow Up Recommendations  No OT follow up    Equipment Recommendations  3 in 1 bedside comode    Recommendations for Other Services       Precautions / Restrictions Precautions Precautions: Fall Restrictions Weight Bearing Restrictions: No Other Position/Activity Restrictions: WBAT      Mobility Bed Mobility               General bed mobility comments: min a for RLE for into/out of bed  Transfers Overall transfer level: Needs assistance Equipment used: Rolling walker (2 wheeled) Transfers: Sit to/from Stand Sit to Stand: Min guard         General transfer comment: for safety; cues for UE placement    Balance                                            ADL Overall ADL's : Needs assistance/impaired             Lower Body Bathing: Moderate assistance;Sit to/from stand       Lower Body Dressing: Maximal assistance;Sit to/from stand   Toilet Transfer: Min guard;Ambulation;BSC;RW   Toileting- Architect and Hygiene: Min guard;Sit to/from stand         General ADL Comments: pt is able to perform UB adls with set up.  Pt was nauseous prior to OT and had settled down. On way back from bathroom, he felt nauseous again.  RN alerted.  he has a tub bench in storage which he can use.  Educated on AE vs assistance from wife.       Vision     Perception     Praxis      Pertinent Vitals/Pain Pain Assessment: Faces Faces Pain Scale: Hurts whole lot Pain Location: R hip     Hand Dominance     Extremity/Trunk Assessment Upper Extremity Assessment Upper Extremity Assessment: Overall WFL for tasks  assessed           Communication Communication Communication: No difficulties   Cognition Arousal/Alertness: Awake/alert Behavior During Therapy: WFL for tasks assessed/performed Overall Cognitive Status: Within Functional Limits for tasks assessed                     General Comments       Exercises       Shoulder Instructions      Home Living Family/patient expects to be discharged to:: Private residence Living Arrangements: Spouse/significant other Available Help at Discharge: Family               Bathroom Shower/Tub: Tub/shower unit Shower/tub characteristics: Curtain Firefighter: Handicapped height     Home Equipment: Tub bench          Prior Functioning/Environment Level of Independence: Independent                 OT Problem List:     OT Treatment/Interventions:      OT Goals(Current goals can be found in the care plan section) Acute Rehab OT Goals Patient Stated  Goal: back to being independent OT Goal Formulation: All assessment and education complete, DC therapy  OT Frequency:     Barriers to D/C:            Co-evaluation              End of Session    Activity Tolerance:  (limited by nausea) Patient left: in bed;with call bell/phone within reach;with family/visitor present   Time: [id number] OT Time Calculation (min): 19 min Charges:  OT General Charges $OT Visit: 1 Procedure OT Evaluation $OT Eval Low Complexity: 1 Procedure G-Codes:    Diar Berkel 12/05/2015, 9:43 AM  Marica OtterMaryellen Paz Winsett, OTR/L [phone] 12/05/2015

## 2015-12-05 NOTE — Discharge Instructions (Signed)

## 2015-12-05 NOTE — Evaluation (Signed)
Physical Therapy Evaluation Patient Details Name: Casey Chase MRN: [id number] DOB: [birth year] Today's Date: 12/05/2015   History of Present Illness  s/p R DA THA  Clinical Impression  The patient is feeling better and wants to DC today. Should progress to that level. Will practice steps this PM and DC. ,Pt admitted with above diagnosis. Pt currently with functional limitations due to the deficits listed below (see PT Problem List).  Pt will benefit from skilled PT to increase their independence and safety with mobility to allow discharge to the venue listed below.       Follow Up Recommendations Home health PT    Equipment Recommendations  None recommended by PT    Recommendations for Other Services       Precautions / Restrictions Precautions Precautions: Fall Restrictions Weight Bearing Restrictions: No Other Position/Activity Restrictions: WBAT      Mobility  Bed Mobility Overal bed mobility: Needs Assistance Bed Mobility: Supine to Sit     Supine to sit: Supervision     General bed mobility comments: manages the right leg to bed edge  Transfers Overall transfer level: Needs assistance Equipment used: Rolling walker (2 wheeled) Transfers: Sit to/from Stand Sit to Stand: Supervision         General transfer comment: for safety; cues for UE placement and sequence, look ahead  Ambulation/Gait Ambulation/Gait assistance: Supervision Ambulation Distance (Feet): 120 Feet Assistive device: Rolling walker (2 wheeled) Gait Pattern/deviations: Step-through pattern     General Gait Details: cues for posture  Stairs            Wheelchair Mobility    Modified Rankin (Stroke Patients Only)       Balance                                             Pertinent Vitals/Pain Pain Assessment: 0-10 Pain Score: 4  Faces Pain Scale: Hurts little more Pain Location: R hip Pain Descriptors / Indicators: Discomfort Pain Intervention(s):  Monitored during session;Premedicated before session;Ice applied    Home Living Family/patient expects to be discharged to:: Private residence Living Arrangements: Spouse/significant other Available Help at Discharge: Family Type of Home: House Home Access: Stairs to enter Entrance Stairs-Rails: None Secretary/administrator of Steps: 3 Home Layout: One level Home Equipment: Environmental consultant - 2 wheels;Crutches      Prior Function Level of Independence: Independent               Hand Dominance        Extremity/Trunk Assessment   Upper Extremity Assessment: Defer to OT evaluation           Lower Extremity Assessment: RLE deficits/detail RLE Deficits / Details: advances the right leg    Cervical / Trunk Assessment: Normal  Communication   Communication: No difficulties  Cognition Arousal/Alertness: Awake/alert Behavior During Therapy: WFL for tasks assessed/performed Overall Cognitive Status: Within Functional Limits for tasks assessed                      General Comments      Exercises Total Joint Exercises Ankle Circles/Pumps: AROM;Both;10 reps Quad Sets: AROM;Both;10 reps Short Arc Quad: AROM;Right;10 reps Heel Slides: AAROM;Right;10 reps Hip ABduction/ADduction: AAROM;Right;10 reps   Assessment/Plan    PT Assessment Patient needs continued PT services  PT Problem List Decreased strength;Decreased range of motion;Decreased activity tolerance;Decreased mobility;Decreased knowledge of precautions;Decreased safety  awareness;Decreased knowledge of use of DME;Pain          PT Treatment Interventions DME instruction;Gait training;Stair training;Functional mobility training;Therapeutic activities;Therapeutic exercise;Patient/family education    PT Goals (Current goals can be found in the Care Plan section)  Acute Rehab PT Goals Patient Stated Goal: go home PT Goal Formulation: With patient/family Time For Goal Achievement: 12/07/15 Potential to Achieve  Goals: Good    Frequency 7X/week   Barriers to discharge        Co-evaluation               End of Session Equipment Utilized During Treatment: Gait belt Activity Tolerance: Patient tolerated treatment well Patient left: in chair;with call bell/phone within reach;with chair alarm set;with family/visitor present Nurse Communication: Mobility status         Time: [id number] PT Time Calculation (min) (ACUTE ONLY): 23 min   Charges:   PT Evaluation $PT Eval Low Complexity: 1 Procedure PT Treatments $Gait Training: 8-22 mins   PT G Codes:        Rada HayHill, Seibert Keeter Elizabeth 12/05/2015, 11:12 AM Blanchard KelchKaren Royer Cristobal PT [phone]

## 2015-12-07 NOTE — Discharge Summary (Signed)
Physician Discharge Summary  Patient ID: Casey Chase MRN: [id number] DOB/AGE: 07-11-1955 60 y.o.  Admit date: 12/04/2015 Discharge date: 12/05/2015   Procedures:  Procedure(s) (LRB): RIGHT TOTAL HIP ARTHROPLASTY ANTERIOR APPROACH (Right)  Attending Physician:  Dr. Durene Romans   Admission Diagnoses:   Right hip primary OA / pain  Discharge Diagnoses:  Principal Problem:   S/P right THA, AA Active Problems:   Overweight (BMI 25.0-29.9)  Past Medical History:  Diagnosis Date  . Arthritis   . GERD (gastroesophageal reflux disease)   . History of hiatal hernia     HPI:    Casey Chase, 60 y.o. male, has a history of pain and functional disability in the right hip(s) due to arthritis and patient has failed non-surgical conservative treatments for greater than 12 weeks to include NSAID's and/or analgesics, corticosteriod injections and activity modification.  Onset of symptoms was gradual starting ~4  years ago with gradually worsening course since that time.The patient noted no past surgery on the right hip(s).  Patient currently rates pain in the right hip at 9 out of 10 with activity. Patient has night pain, worsening of pain with activity and weight bearing, trendelenberg gait, pain that interfers with activities of daily living and pain with passive range of motion. Patient has evidence of periarticular osteophytes and joint space narrowing by imaging studies. This condition presents safety issues increasing the risk of falls.  There is no current active infection.   Risks, benefits and expectations were discussed with the patient.  Risks including but not limited to the risk of anesthesia, blood clots, nerve damage, blood vessel damage, failure of the prosthesis, infection and up to and including death.  Patient understand the risks, benefits and expectations and wishes to proceed with surgery.   PCP: HURDLE, CAITLIN E, NP   Discharged Condition: good  Hospital Course:   Patient underwent the above stated procedure on 12/04/2015. Patient tolerated the procedure well and brought to the recovery room in good condition and subsequently to the floor.  POD #1 BP: 115/56 ; Pulse: 81 ; Temp: 98.5 F (36.9 C) ; Resp: 20 Patient reports pain as mild, pain controlled. No events throughout the night.  States he has been up and on the bedside commode.  He has been passing gas, but no BM yet.  Ready to work with PT.  Ready to be discharged home. Dorsiflexion/plantar flexion intact, incision: dressing C/D/I, no cellulitis present and compartment soft.   LABS  Basename    HGB     11.9  HCT     36.0    Discharge Exam: General appearance: alert, cooperative and no distress Extremities: Homans sign is negative, no sign of DVT, no edema, redness or tenderness in the calves or thighs and no ulcers, gangrene or trophic changes  Disposition: Home with follow up in 2 weeks   Follow-up Information    Shelda Pal, MD. Schedule an appointment as soon as possible for a visit in 2 week(s).   Specialty:  Orthopedic Surgery Contact information: [address] [place] [address] [phone]        [facility] .   Why:  now known as [facility]; this home health agency will call you to schedule your at home physical therapy Contact information: [address] [place] [address] [phone]        Inc[facility] - [facility] .   Why:  3n1 (over the commode seat) Contact information:  [address] [place] [address] [phone]           Discharge Instructions    Call MD / Call 911    Complete by:  As directed    If you experience chest pain or shortness of breath, CALL 911 and be transported to the hospital emergency room.  If you develope a fever above 101 F, pus (white drainage) or increased drainage or redness at the wound, or calf pain, call your surgeon's office.   Change dressing    Complete  by:  As directed    Maintain surgical dressing until follow up in the clinic. If the edges start to pull up, may reinforce with tape. If the dressing is no longer working, may remove and cover with gauze and tape, but must keep the area dry and clean.  Call with any questions or concerns.   Constipation Prevention    Complete by:  As directed    Drink plenty of fluids.  Prune juice may be helpful.  You may use a stool softener, such as Colace (over the counter) 100 mg twice a day.  Use MiraLax (over the counter) for constipation as needed.   Diet - low sodium heart healthy    Complete by:  As directed    Discharge instructions    Complete by:  As directed    Maintain surgical dressing until follow up in the clinic. If the edges start to pull up, may reinforce with tape. If the dressing is no longer working, may remove and cover with gauze and tape, but must keep the area dry and clean.  [facility]. Call with any questions or concerns.   Increase activity slowly as tolerated    Complete by:  As directed    Weight bearing as tolerated with assist device (walker, cane, etc) as directed, use it as long as suggested by your surgeon or therapist, typically at least 4-6 weeks.   TED hose    Complete by:  As directed    Use stockings (TED hose) for 2 weeks on both leg(s).  You may remove them at night for sleeping.        Medication List    STOP taking these medications   acetaminophen 500 MG tablet Commonly known as:  TYLENOL   naproxen 500 MG tablet Commonly known as:  NAPROSYN     TAKE these medications   aspirin 81 MG chewable tablet Chew 1 tablet (81 mg total) by mouth 2 (two) times daily. Take for 4 weeks.   B-12 PO Take 1 tablet by mouth once a week.   docusate sodium 100 MG capsule Commonly known as:  COLACE Take 1 capsule (100 mg total) by mouth 2 (two) times daily.   ferrous sulfate 325 (65 FE) MG tablet Take 1 tablet (325 mg total) by  mouth 3 (three) times daily after meals.   HYDROcodone-acetaminophen 7.5-325 MG tablet Commonly known as:  NORCO Take 1-2 tablets by mouth every 4 (four) hours as needed for moderate pain.   methocarbamol 500 MG tablet Commonly known as:  ROBAXIN Take 1 tablet (500 mg total) by mouth every 6 (six) hours as needed for muscle spasms.   polyethylene glycol packet Commonly known as:  MIRALAX / GLYCOLAX Take 17 g by mouth 2 (two) times daily.   promethazine 12.5 MG tablet Commonly known as:  PHENERGAN Take 1 tablet (12.5 mg total) by mouth every 6 (six) hours as  needed for nausea or vomiting.   VITAMIN C PO Take 1 tablet by mouth 2 (two) times a week.        Signed: Anastasio Auerbach. Mithcell Schumpert   PA-C  12/07/2015, 11:28 PM

## 2018-08-03 IMAGING — DX DG HIP (WITH OR WITHOUT PELVIS) 1V PORT*R*
2 series · 2 of 2 positions shown · non-contrast
Comparison: None.

CLINICAL DATA: Postop right hip replacement

EXAM:
DG HIP (WITH OR WITHOUT PELVIS) 1V PORT RIGHT

[pelvis ap]
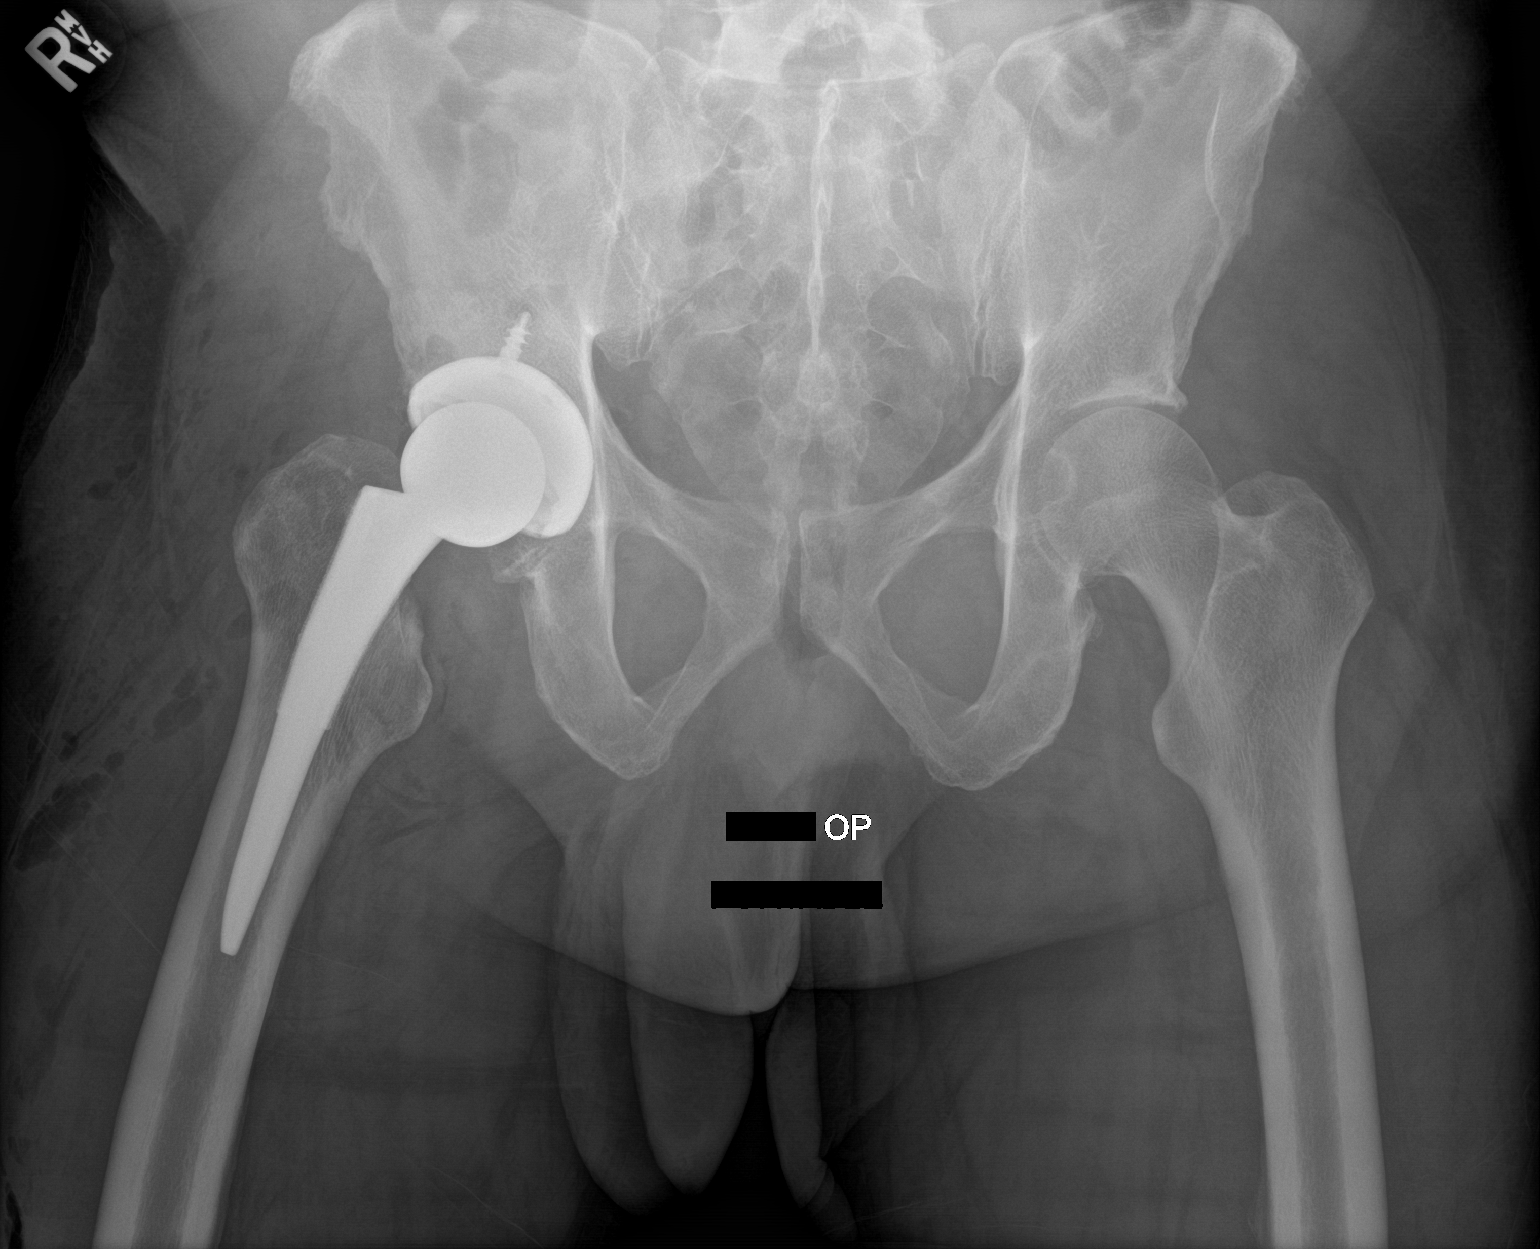

[hip lat]
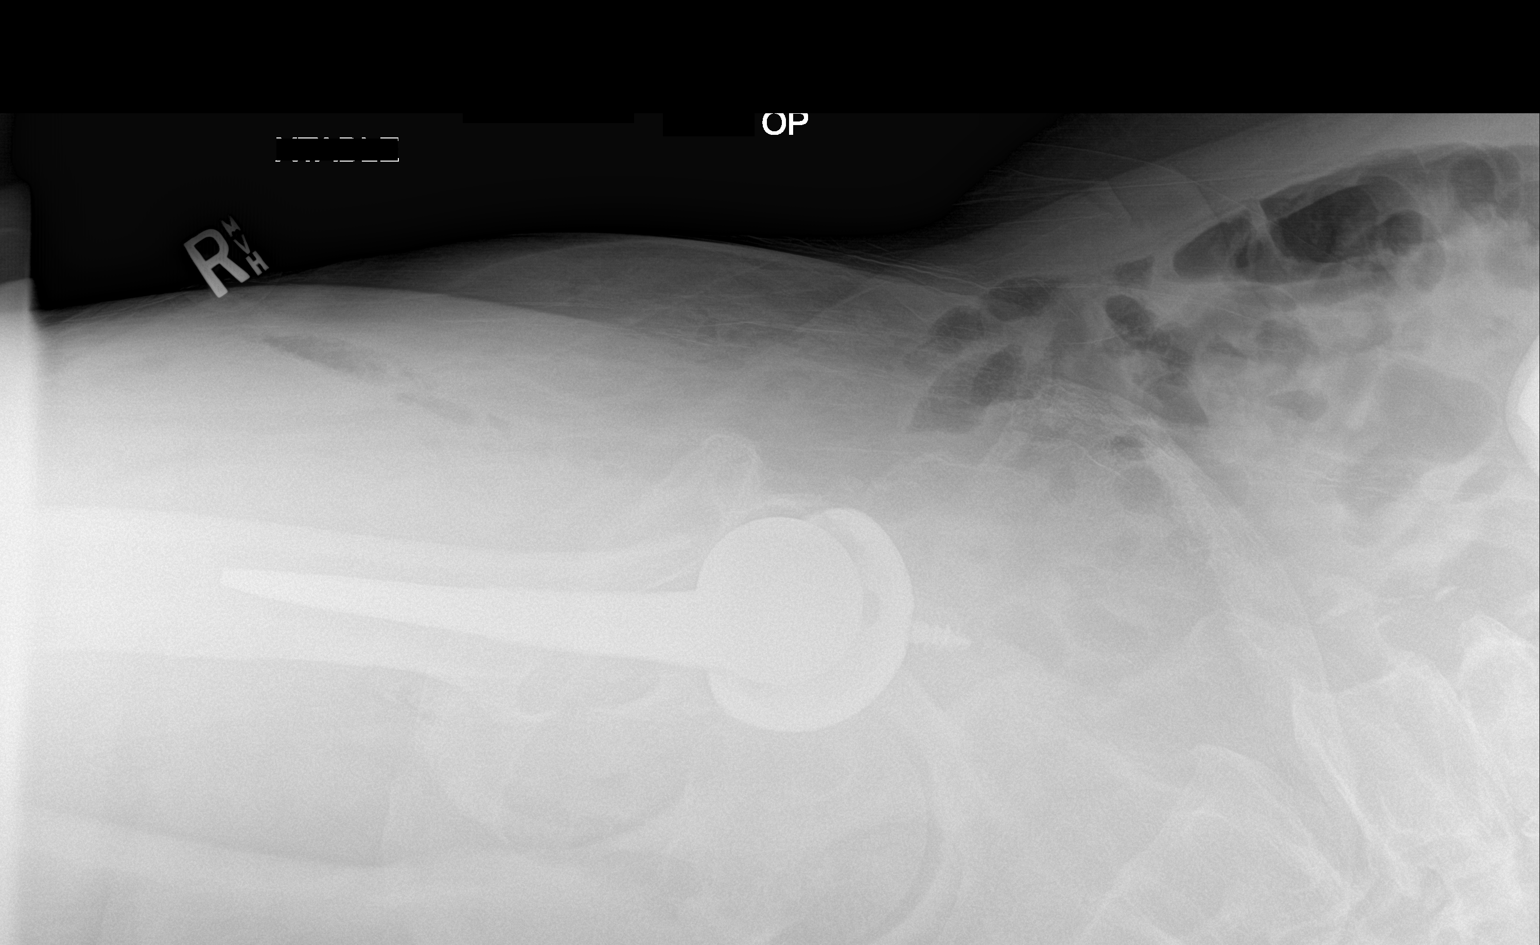

[2 of 2 positions shown; findings below may reference images not displayed]

FINDINGS: A view of the pelvis and cross-table lateral view of the right hip
show the femoral and acetabular components of the right total hip
replacement to be in good position. No complicating features are
seen. Mild degenerative change of the left hip is noted.
IMPRESSION: Right total hip replacement components in good position. No
complicating features.

## 2018-11-02 DIAGNOSIS — M5136 Other intervertebral disc degeneration, lumbar region: Secondary | ICD-10-CM | POA: Insufficient documentation

## 2018-11-02 DIAGNOSIS — M48062 Spinal stenosis, lumbar region with neurogenic claudication: Secondary | ICD-10-CM | POA: Insufficient documentation

## 2020-11-20 DIAGNOSIS — M5416 Radiculopathy, lumbar region: Secondary | ICD-10-CM | POA: Insufficient documentation

## 2021-01-01 ENCOUNTER — Other Ambulatory Visit: Payer: Self-pay | Admitting: Orthopedic Surgery

## 2021-01-01 ENCOUNTER — Other Ambulatory Visit: Payer: Self-pay

## 2021-01-28 ENCOUNTER — Encounter: Payer: Self-pay | Admitting: Surgery

## 2021-01-28 ENCOUNTER — Ambulatory Visit (INDEPENDENT_AMBULATORY_CARE_PROVIDER_SITE_OTHER): Payer: Medicare Other | Admitting: Surgery

## 2021-01-28 ENCOUNTER — Other Ambulatory Visit: Payer: Self-pay

## 2021-01-28 VITALS — BP 148/86 | HR 87 | Temp 98.4°F | Resp 20 | Ht 67.0 in | Wt 206.9 lb

## 2021-01-28 DIAGNOSIS — M5137 Other intervertebral disc degeneration, lumbosacral region: Secondary | ICD-10-CM

## 2021-01-28 NOTE — Progress Notes (Signed)
Vascular and Vein Specialist of [facility]  Patient name: Casey Chase MRN: [id number] DOB: [birth year] Sex: male   REQUESTING PROVIDER:   Dr. Yevette Edwards   REASON FOR CONSULT:    Anterior approach for back surgery  HISTORY OF PRESENT ILLNESS:   Casey Chase is a 65 y.o. male, who is referred for discussions regarding degenerative back disease.  The patient has been seen by Dr. Yevette Edwards who has recommended anterior approach to the L5-S1 disc space.  Patient has not had any prior abdominal surgeries.  He is a former smoker.  PAST MEDICAL HISTORY    Past Medical History:  Diagnosis Date   Arthritis    Back pain    GERD (gastroesophageal reflux disease)    History of hiatal hernia      FAMILY HISTORY   History reviewed. No pertinent family history.  SOCIAL HISTORY:   Social History   Socioeconomic History   Marital status: Married    Spouse name: Not on file   Number of children: Not on file   Years of education: Not on file   Highest education level: Not on file  Occupational History   Not on file  Tobacco Use   Smoking status: Former   Smokeless tobacco: Never  Substance and Sexual Activity   Alcohol use: Yes    Alcohol/week: 12.0 standard drinks    Types: 12 Cans of beer per week   Drug use: No   Sexual activity: Not on file  Other Topics Concern   Not on file  Social History Narrative   Not on file   Social Determinants of Health   Financial Resource Strain: Not on file  Food Insecurity: Not on file  Transportation Needs: Not on file  Physical Activity: Not on file  Stress: Not on file  Social Connections: Not on file  Intimate Partner Violence: Not on file    ALLERGIES:    No Known Allergies  CURRENT MEDICATIONS:    Current Outpatient Medications  Medication Sig Dispense Refill   atorvastatin (LIPITOR) 10 MG tablet Take 1 tablet by mouth daily.     naproxen sodium (ALEVE) 220 MG tablet Take by mouth.      tiZANidine (ZANAFLEX) 4 MG tablet Take 4 mg by mouth every 8 (eight) hours as needed.     No current facility-administered medications for this visit.    REVIEW OF SYSTEMS:   [X]  denotes positive finding, [ ]  denotes negative finding Cardiac  Comments:  Chest pain or chest pressure:    Shortness of breath upon exertion:    Short of breath when lying flat:    Irregular heart rhythm:        Vascular    Pain in calf, thigh, or hip brought on by ambulation:    Pain in feet at night that wakes you up from your sleep:     Blood clot in your veins:    Leg swelling:         Pulmonary    Oxygen at home:    Productive cough:     Wheezing:         Neurologic    Sudden weakness in arms or legs:     Sudden numbness in arms or legs:     Sudden onset of difficulty speaking or slurred speech:    Temporary loss of vision in one eye:     Problems with dizziness:         Gastrointestinal    Blood in stool:  Vomited blood:         Genitourinary    Burning when urinating:     Blood in urine:        Psychiatric    Major depression:         Hematologic    Bleeding problems:    Problems with blood clotting too easily:        Skin    Rashes or ulcers:        Constitutional    Fever or chills:     PHYSICAL EXAM:   Vitals:   01/28/21 1124  BP: (!) 148/86  Pulse: 87  Resp: 20  Temp: 98.4 F (36.9 C)  SpO2: 95%  Weight: 206 lb 14.4 oz (93.8 kg)  Height: 5\' 7"  (1.702 m)    GENERAL: The patient is a well-nourished male, in no acute distress. The vital signs are documented above. CARDIAC: There is a regular rate and rhythm.  VASCULAR: Palpable left dorsalis pedis pulse PULMONARY: Nonlabored respirations ABDOMEN: Soft and non-tender with normal pitched bowel sounds.  MUSCULOSKELETAL: There are no major deformities or cyanosis. NEUROLOGIC: No focal weakness or paresthesias are detected. SKIN: There are no ulcers or rashes noted. PSYCHIATRIC: The patient has a normal  affect.  STUDIES:   I have reviewed his MRI.  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  I will be there to provide exposure.  This will require moving the iliac artery and vein for better exposure.  I discussed the potential risk of injury to the structures.  We also discussed the risk of retrograde ejaculation as well as hernia and infection.  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  I do not expect this to cause issues with exposure however I told the patient that if I cannot get adequate exposure because of his pelvic kidney, I would need to abort this portion of the procedure.  He understands this.  The patient is complaining of right-sided upper extremity numbness and tingling.  He is going to discuss this further with Dr. prior to surgery.   Yevette Edwards, MD, FACS Vascular and Vein Specialists of [facility] [phone] Pager [phone]

## 2021-01-28 NOTE — H&P (View-Only) (Signed)
 Vascular and Vein Specialist of [place]  Patient name: Casey Chase MRN: [id number] DOB: 12/27/1955 Sex: male   REQUESTING PROVIDER:   Dr. Dumonski   REASON FOR CONSULT:    Anterior approach for back surgery  HISTORY OF PRESENT ILLNESS:   Casey Chase is a 65 y.o. male, who is referred for discussions regarding degenerative back disease.  The patient has been seen by Dr. Dumonski who has recommended anterior approach to the L5-S1 disc space.  Patient has not had any prior abdominal surgeries.  He is a former smoker.  PAST MEDICAL HISTORY    Past Medical History:  Diagnosis Date   Arthritis    Back pain    GERD (gastroesophageal reflux disease)    History of hiatal hernia      FAMILY HISTORY   History reviewed. No pertinent family history.  SOCIAL HISTORY:   Social History   Socioeconomic History   Marital status: Married    Spouse name: Not on file   Number of children: Not on file   Years of education: Not on file   Highest education level: Not on file  Occupational History   Not on file  Tobacco Use   Smoking status: Former   Smokeless tobacco: Never  Substance and Sexual Activity   Alcohol use: Yes    Alcohol/week: 12.0 standard drinks    Types: 12 Cans of beer per week   Drug use: No   Sexual activity: Not on file  Other Topics Concern   Not on file  Social History Narrative   Not on file   Social Determinants of Health   Financial Resource Strain: Not on file  Food Insecurity: Not on file  Transportation Needs: Not on file  Physical Activity: Not on file  Stress: Not on file  Social Connections: Not on file  Intimate Partner Violence: Not on file    ALLERGIES:    No Known Allergies  CURRENT MEDICATIONS:    Current Outpatient Medications  Medication Sig Dispense Refill   atorvastatin (LIPITOR) 10 MG tablet Take 1 tablet by mouth daily.     naproxen sodium (ALEVE) 220 MG tablet Take by mouth.      tiZANidine (ZANAFLEX) 4 MG tablet Take 4 mg by mouth every 8 (eight) hours as needed.     No current facility-administered medications for this visit.    REVIEW OF SYSTEMS:   [X] denotes positive finding, [ ] denotes negative finding Cardiac  Comments:  Chest pain or chest pressure:    Shortness of breath upon exertion:    Short of breath when lying flat:    Irregular heart rhythm:        Vascular    Pain in calf, thigh, or hip brought on by ambulation:    Pain in feet at night that wakes you up from your sleep:     Blood clot in your veins:    Leg swelling:         Pulmonary    Oxygen at home:    Productive cough:     Wheezing:         Neurologic    Sudden weakness in arms or legs:     Sudden numbness in arms or legs:     Sudden onset of difficulty speaking or slurred speech:    Temporary loss of vision in one eye:     Problems with dizziness:         Gastrointestinal    Blood in stool:        Vomited blood:         Genitourinary    Burning when urinating:     Blood in urine:        Psychiatric    Major depression:         Hematologic    Bleeding problems:    Problems with blood clotting too easily:        Skin    Rashes or ulcers:        Constitutional    Fever or chills:     PHYSICAL EXAM:   Vitals:   01/28/21 1124  BP: (!) 148/86  Pulse: 87  Resp: 20  Temp: 98.4 F (36.9 C)  SpO2: 95%  Weight: 206 lb 14.4 oz (93.8 kg)  Height: 5\' 7"  (1.702 m)    GENERAL: The patient is a well-nourished male, in no acute distress. The vital signs are documented above. CARDIAC: There is a regular rate and rhythm.  VASCULAR: Palpable left dorsalis pedis pulse PULMONARY: Nonlabored respirations ABDOMEN: Soft and non-tender with normal pitched bowel sounds.  MUSCULOSKELETAL: There are no major deformities or cyanosis. NEUROLOGIC: No focal weakness or paresthesias are detected. SKIN: There are no ulcers or rashes noted. PSYCHIATRIC: The patient has a normal  affect.  STUDIES:   I have reviewed his MRI.  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  I will be there to provide exposure.  This will require moving the iliac artery and vein for better exposure.  I discussed the potential risk of injury to the structures.  We also discussed the risk of retrograde ejaculation as well as hernia and infection.  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  I do not expect this to cause issues with exposure however I told the patient that if I cannot get adequate exposure because of his pelvic kidney, I would need to abort this portion of the procedure.  He understands this.  The patient is complaining of right-sided upper extremity numbness and tingling.  He is going to discuss this further with Dr. prior to surgery.   Yevette Edwards, MD, FACS Vascular and Vein Specialists of [facility] [phone] Pager [phone]

## 2021-02-01 ENCOUNTER — Other Ambulatory Visit: Payer: Self-pay

## 2021-02-08 NOTE — Progress Notes (Signed)
Surgical Instructions    Your procedure is scheduled on Wednesday December 14th.  Report to [facility] Main Entrance "A" at 5:30 A.M., then check in with the Admitting office.  Call this number if you have problems the morning of surgery:  [phone]   If you have any questions prior to your surgery date call [phone]: Open Monday-Friday 8am-4pm    Remember:  Do not eat after midnight the night before your surgery  You may drink clear liquids until 5:30am the morning of your surgery.   Clear liquids allowed are: Water, Non-Citrus Juices (without pulp), Carbonated Beverages, Clear Tea, Black Coffee ONLY (NO MILK, CREAM OR POWDERED CREAMER of any kind), and Gatorade    Take these medicines the morning of surgery with A SIP OF WATER atorvastatin (LIPITOR) 10 MG tablet tiZANidine (ZANAFLEX) 4 MG tablet   As of today, STOP taking any Aspirin (unless otherwise instructed by your surgeon) Aleve, Naproxen, Ibuprofen, Motrin, Advil, Goody's, BC's, all herbal medications, fish oil, and all vitamins.   After your COVID test   You are not required to quarantine however you are required to wear a well-fitting mask when you are out and around people not in your household.  If your mask becomes wet or soiled, replace with a new one.  Wash your hands often with soap and water for 20 seconds or clean your hands with an alcohol-based hand sanitizer that contains at least 60% alcohol.  Do not share personal items.  Notify your provider: if you are in close contact with someone who has COVID  or if you develop a fever of 100.4 or greater, sneezing, cough, sore throat, shortness of breath or body aches.             Do not wear jewelry  Do not wear lotions, powders, colognes, or deodorant. Do not shave 48 hours prior to surgery.  Men may shave face and neck. Do not bring valuables to the hospital. DO Not wear nail polish, gel polish, artificial nails, or any other type of covering on  natural nails including finger and toenails. If patients have artificial nails, gel coating, etc. that need to be removed by a nail salon, please have this removed prior to surgery or surgery may need to be canceled/delayed if the surgeon/ anesthesia feels like the patient is unable to be adequately monitored.             [facility] is not responsible for any belongings or valuables.  Do NOT Smoke (Tobacco/Vaping)  24 hours prior to your procedure  If you use a CPAP at night, you may bring your mask for your overnight stay.   Contacts, glasses, hearing aids, dentures or partials may not be worn into surgery, please bring cases for these belongings   For patients admitted to the hospital, discharge time will be determined by your treatment team.   Patients discharged the day of surgery will not be allowed to drive home, and someone needs to stay with them for 24 hours.  NO VISITORS WILL BE ALLOWED IN PRE-OP WHERE PATIENTS ARE PREPPED FOR SURGERY.  ONLY 1 SUPPORT PERSON MAY BE PRESENT IN THE WAITING ROOM WHILE YOU ARE IN SURGERY.  IF YOU ARE TO BE ADMITTED, ONCE YOU ARE IN YOUR ROOM YOU WILL BE ALLOWED TWO (2) VISITORS. 1 (ONE) VISITOR MAY STAY OVERNIGHT BUT MUST ARRIVE TO THE ROOM BY 8pm.  Minor children may have two parents present. Special consideration for safety and communication needs will  be reviewed on a case by case basis.  Special instructions:    Oral Hygiene is also important to reduce your risk of infection.  Remember - BRUSH YOUR TEETH THE MORNING OF SURGERY WITH YOUR REGULAR TOOTHPASTE   [place]- Preparing For Surgery  Before surgery, you can play an important role. Because skin is not sterile, your skin needs to be as free of germs as possible. You can reduce the number of germs on your skin by washing with CHG (chlorahexidine gluconate) Soap before surgery.  CHG is an antiseptic cleaner which kills germs and bonds with the skin to continue killing germs even after washing.      Please do not use if you have an allergy to CHG or antibacterial soaps. If your skin becomes reddened/irritated stop using the CHG.  Do not shave (including legs and underarms) for at least 48 hours prior to first CHG shower. It is OK to shave your face.  Please follow these instructions carefully.     Shower the NIGHT BEFORE SURGERY and the MORNING OF SURGERY with CHG Soap.   If you chose to wash your hair, wash your hair first as usual with your normal shampoo. After you shampoo, rinse your hair and body thoroughly to remove the shampoo.  Then [employer] and genitals (private parts) with your normal soap and rinse thoroughly to remove soap.  After that Use CHG Soap as you would any other liquid soap. You can apply CHG directly to the skin and wash gently with a scrungie or a clean washcloth.   Apply the CHG Soap to your body ONLY FROM THE NECK DOWN.  Do not use on open wounds or open sores. Avoid contact with your eyes, ears, mouth and genitals (private parts). Wash Face and genitals (private parts)  with your normal soap.   Wash thoroughly, paying special attention to the area where your surgery will be performed.  Thoroughly rinse your body with warm water from the neck down.  DO NOT shower/wash with your normal soap after using and rinsing off the CHG Soap.  Pat yourself dry with a CLEAN TOWEL.  Wear CLEAN PAJAMAS to bed the night before surgery  Place CLEAN SHEETS on your bed the night before your surgery  DO NOT SLEEP WITH PETS.   Day of Surgery:  Take a shower with CHG soap. Wear Clean/Comfortable clothing the morning of surgery Do not apply any deodorants/lotions.   Remember to brush your teeth WITH YOUR REGULAR TOOTHPASTE.   Please read over the following fact sheets that you were given.

## 2021-02-11 ENCOUNTER — Other Ambulatory Visit: Payer: Self-pay

## 2021-02-11 ENCOUNTER — Encounter (HOSPITAL_COMMUNITY)
Admission: RE | Admit: 2021-02-11 | Discharge: 2021-02-11 | Disposition: A | Discharge status: Home / Self Care | Payer: Medicare Other | Source: Ambulatory Visit | Attending: Orthopedic Surgery | Admitting: Orthopedic Surgery

## 2021-02-11 ENCOUNTER — Encounter (HOSPITAL_COMMUNITY): Payer: Self-pay

## 2021-02-11 VITALS — BP 166/82 | HR 87 | Temp 98.2°F | Resp 19 | Ht 67.0 in | Wt 210.0 lb

## 2021-02-11 DIAGNOSIS — Z01818 Encounter for other preprocedural examination: Secondary | ICD-10-CM | POA: Insufficient documentation

## 2021-02-11 DIAGNOSIS — M5410 Radiculopathy, site unspecified: Secondary | ICD-10-CM | POA: Insufficient documentation

## 2021-02-11 DIAGNOSIS — Z20822 Contact with and (suspected) exposure to covid-19: Secondary | ICD-10-CM | POA: Insufficient documentation

## 2021-02-11 LAB — COMPREHENSIVE METABOLIC PANEL
ALT: 30 U/L (ref 0–44)
AST: 34 U/L (ref 15–41)
Albumin: 4.2 g/dL (ref 3.5–5.0)
Alkaline Phosphatase: 53 U/L (ref 38–126)
Anion gap: 7 (ref 5–15)
BUN: 12 mg/dL (ref 8–23)
CO2: 27 mmol/L (ref 22–32)
Calcium: 9.3 mg/dL (ref 8.9–10.3)
Chloride: 107 mmol/L (ref 98–111)
Creatinine, Ser: 0.91 mg/dL (ref 0.61–1.24)
GFR, Estimated: >60 mL/min (ref >60)
Glucose, Bld: 107 mg/dL — ABNORMAL HIGH (ref 70–99)
Potassium: 3.9 mmol/L (ref 3.5–5.1)
Sodium: 141 mmol/L (ref 135–145)
Total Bilirubin: 0.5 mg/dL (ref 0.3–1.2)
Total Protein: 7.2 g/dL (ref 6.5–8.1)

## 2021-02-11 LAB — TYPE AND SCREEN
ABO/RH(D): A POS
Antibody Screen: NEGATIVE

## 2021-02-11 LAB — CBC WITH DIFFERENTIAL/PLATELET
Abs Immature Granulocytes: 0.04 10*3/uL (ref 0.00–0.07)
Basophils Absolute: 0 10*3/uL (ref 0.0–0.1)
Basophils Relative: 1 %
Eosinophils Absolute: 0.1 10*3/uL (ref 0.0–0.5)
Eosinophils Relative: 1 %
HCT: 45.5 % (ref 39.0–52.0)
Hemoglobin: 15 g/dL (ref 13.0–17.0)
Immature Granulocytes: 1 %
Lymphocytes Relative: 44 %
Lymphs Abs: 2.8 10*3/uL (ref 0.7–4.0)
MCH: 30.6 pg (ref 26.0–34.0)
MCHC: 33 g/dL (ref 30.0–36.0)
MCV: 92.9 fL (ref 80.0–100.0)
Monocytes Absolute: 0.7 10*3/uL (ref 0.1–1.0)
Monocytes Relative: 12 %
Neutro Abs: 2.5 10*3/uL (ref 1.7–7.7)
Neutrophils Relative %: 41 %
Platelets: 299 10*3/uL (ref 150–400)
RBC: 4.9 MIL/uL (ref 4.22–5.81)
RDW: 14.2 % (ref 11.5–15.5)
WBC: 6.1 10*3/uL (ref 4.0–10.5)
nRBC: 0 % (ref 0.0–0.2)

## 2021-02-11 LAB — URINALYSIS, ROUTINE W REFLEX MICROSCOPIC
Bilirubin Urine: NEGATIVE
Glucose, UA: NEGATIVE mg/dL
Hgb urine dipstick: NEGATIVE
Ketones, ur: NEGATIVE mg/dL
Leukocytes,Ua: NEGATIVE
Nitrite: NEGATIVE
Protein, ur: NEGATIVE mg/dL
Specific Gravity, Urine: 1.015 (ref 1.005–1.030)
pH: 7 (ref 5.0–8.0)

## 2021-02-11 LAB — PROTIME-INR
INR: 1 (ref 0.8–1.2)
Prothrombin Time: 13.6 seconds (ref 11.4–15.2)

## 2021-02-11 LAB — SARS CORONAVIRUS 2 (TAT 6-24 HRS): SARS Coronavirus 2: NEGATIVE

## 2021-02-11 LAB — SURGICAL PCR SCREEN
MRSA, PCR: NEGATIVE
Staphylococcus aureus: NEGATIVE

## 2021-02-11 LAB — APTT: aPTT: 38 seconds — ABNORMAL HIGH (ref 24–36)

## 2021-02-11 NOTE — Progress Notes (Signed)
PCP - Dr. Jessee Avers Cardiologist - denies  PPM/ICD - n/a  Chest x-ray - n/a EKG - n/a Stress Test -denies  ECHO - denies Cardiac Cath - denies  Sleep Study - denies CPAP - denies  Blood Thinner Instructions: n/a Aspirin Instructions: n/a  ERAS Protcol -Clear liquids until 0430 DOS PRE-SURGERY Ensure or G2- Pt given (1) pre-surgical ensure.  COVID TEST- 02/11/21; done in PAT.  Anesthesia review: No  Patient denies shortness of breath, fever, cough and chest pain at PAT appointment   All instructions explained to the patient, with a verbal understanding of the material. Patient agrees to go over the instructions while at home for a better understanding. Patient also instructed to self quarantine after being tested for COVID-19. The opportunity to ask questions was provided.

## 2021-02-11 NOTE — Progress Notes (Signed)
Surgical Instructions    Your procedure is scheduled on Wednesday December 14th.  Report to [facility] Main Entrance "A" at 5:30 A.M., then check in with the Admitting office.  Call this number if you have problems the morning of surgery:  [phone]   If you have any questions prior to your surgery date call [phone]: Open Monday-Friday 8am-4pm    Remember:  Do not eat after midnight the night before your surgery  You may drink clear liquids until 5:30am the morning of your surgery.   Clear liquids allowed are: Water, Non-Citrus Juices (without pulp), Carbonated Beverages, Clear Tea, Black Coffee ONLY (NO MILK, CREAM OR POWDERED CREAMER of any kind), and Gatorade.   Enhanced Recovery after Surgery for Orthopedics Enhanced Recovery after Surgery is a protocol used to improve the stress on your body and your recovery after surgery.  Patient Instructions  The day of surgery (if you do NOT have diabetes):  Drink ONE (1) Pre-Surgery Clear Ensure by 4:30 am the morning of surgery   This drink was given to you during your hospital  pre-op appointment visit. Nothing else to drink after completing the  Pre-Surgery Clear Ensure.         If you have questions, please contact your surgeon's office.     Take these medicines the morning of surgery with A SIP OF WATER atorvastatin (LIPITOR) 10 MG tablet tiZANidine (ZANAFLEX) 4 MG tablet   As of today, STOP taking any Aspirin (unless otherwise instructed by your surgeon) Aleve, Naproxen, Ibuprofen, Motrin, Advil, Goody's, BC's, all herbal medications, fish oil, and all vitamins.   After your COVID test   You are not required to quarantine however you are required to wear a well-fitting mask when you are out and around people not in your household.  If your mask becomes wet or soiled, replace with a new one.  Wash your hands often with soap and water for 20 seconds or clean your hands with an alcohol-based hand sanitizer that  contains at least 60% alcohol.  Do not share personal items.  Notify your provider: if you are in close contact with someone who has COVID  or if you develop a fever of 100.4 or greater, sneezing, cough, sore throat, shortness of breath or body aches.             Do not wear jewelry  Do not wear lotions, powders, colognes, or deodorant. Do not shave 48 hours prior to surgery.  Men may shave face and neck. Do not bring valuables to the hospital. DO Not wear nail polish, gel polish, artificial nails, or any other type of covering on natural nails including finger and toenails. If patients have artificial nails, gel coating, etc. that need to be removed by a nail salon, please have this removed prior to surgery or surgery may need to be canceled/delayed if the surgeon/ anesthesia feels like the patient is unable to be adequately monitored.             [facility] is not responsible for any belongings or valuables.  Do NOT Smoke (Tobacco/Vaping)  24 hours prior to your procedure  If you use a CPAP at night, you may bring your mask for your overnight stay.   Contacts, glasses, hearing aids, dentures or partials may not be worn into surgery, please bring cases for these belongings   For patients admitted to the hospital, discharge time will be determined by your treatment team.   Patients discharged the day  of surgery will not be allowed to drive home, and someone needs to stay with them for 24 hours.  NO VISITORS WILL BE ALLOWED IN PRE-OP WHERE PATIENTS ARE PREPPED FOR SURGERY.  ONLY 1 SUPPORT PERSON MAY BE PRESENT IN THE WAITING ROOM WHILE YOU ARE IN SURGERY.  IF YOU ARE TO BE ADMITTED, ONCE YOU ARE IN YOUR ROOM YOU WILL BE ALLOWED TWO (2) VISITORS. 1 (ONE) VISITOR MAY STAY OVERNIGHT BUT MUST ARRIVE TO THE ROOM BY 8pm.  Minor children may have two parents present. Special consideration for safety and communication needs will be reviewed on a case by case basis.  Special instructions:     Oral Hygiene is also important to reduce your risk of infection.  Remember - BRUSH YOUR TEETH THE MORNING OF SURGERY WITH YOUR REGULAR TOOTHPASTE   [place]- Preparing For Surgery  Before surgery, you can play an important role. Because skin is not sterile, your skin needs to be as free of germs as possible. You can reduce the number of germs on your skin by washing with CHG (chlorahexidine gluconate) Soap before surgery.  CHG is an antiseptic cleaner which kills germs and bonds with the skin to continue killing germs even after washing.     Please do not use if you have an allergy to CHG or antibacterial soaps. If your skin becomes reddened/irritated stop using the CHG.  Do not shave (including legs and underarms) for at least 48 hours prior to first CHG shower. It is OK to shave your face.  Please follow these instructions carefully.     Shower the NIGHT BEFORE SURGERY and the MORNING OF SURGERY with CHG Soap.   If you chose to wash your hair, wash your hair first as usual with your normal shampoo. After you shampoo, rinse your hair and body thoroughly to remove the shampoo.  Then [employer] and genitals (private parts) with your normal soap and rinse thoroughly to remove soap.  After that Use CHG Soap as you would any other liquid soap. You can apply CHG directly to the skin and wash gently with a scrungie or a clean washcloth.   Apply the CHG Soap to your body ONLY FROM THE NECK DOWN.  Do not use on open wounds or open sores. Avoid contact with your eyes, ears, mouth and genitals (private parts). Wash Face and genitals (private parts)  with your normal soap.   Wash thoroughly, paying special attention to the area where your surgery will be performed.  Thoroughly rinse your body with warm water from the neck down.  DO NOT shower/wash with your normal soap after using and rinsing off the CHG Soap.  Pat yourself dry with a CLEAN TOWEL.  Wear CLEAN PAJAMAS to bed the night before  surgery  Place CLEAN SHEETS on your bed the night before your surgery  DO NOT SLEEP WITH PETS.   Day of Surgery:  Take a shower with CHG soap. Wear Clean/Comfortable clothing the morning of surgery Do not apply any deodorants/lotions.   Remember to brush your teeth WITH YOUR REGULAR TOOTHPASTE.   Please read over the following fact sheets that you were given.

## 2021-02-12 NOTE — Anesthesia Preprocedure Evaluation (Addendum)
Anesthesia Evaluation  Patient identified by MRN, date of birth, ID band Patient awake    Reviewed: Allergy & Precautions, NPO status , Patient's Chart, lab work & pertinent test results  Airway Mallampati: II  TM Distance: >3 FB Neck ROM: Full    Dental  (+) Partial Lower   Pulmonary neg pulmonary ROS, former smoker,    Pulmonary exam normal        Cardiovascular negative cardio ROS   Rhythm:Regular Rate:Normal     Neuro/Psych negative neurological ROS  negative psych ROS   GI/Hepatic Neg liver ROS, hiatal hernia, GERD  ,  Endo/Other  negative endocrine ROS  Renal/GU negative Renal ROS  negative genitourinary   Musculoskeletal  (+) Arthritis , Osteoarthritis,  Spinal stenosis    Abdominal Normal abdominal exam  (+)   Peds  Hematology negative hematology ROS (+)   Anesthesia Other Findings   Reproductive/Obstetrics                            Anesthesia Physical Anesthesia Plan  ASA: 2  Anesthesia Plan: General   Post-op Pain Management: Tylenol PO (pre-op) and Celebrex PO (pre-op)   Induction: Intravenous  PONV Risk Score and Plan: 2 and Ondansetron, Dexamethasone, Midazolam and Treatment may vary due to age or medical condition  Airway Management Planned: Mask and Oral ETT  Additional Equipment: None  Intra-op Plan:   Post-operative Plan: Extubation in OR  Informed Consent: I have reviewed the patients History and Physical, chart, labs and discussed the procedure including the risks, benefits and alternatives for the proposed anesthesia with the patient or authorized representative who has indicated his/her understanding and acceptance.     Dental advisory given  Plan Discussed with: CRNA  Anesthesia Plan Comments: (Lab Results      Component                Value               Date                      WBC                      6.1                 02/11/2021                 HGB                      15.0                02/11/2021                HCT                      45.5                02/11/2021                MCV                      92.9                02/11/2021                PLT  299                 02/11/2021           Lab Results      Component                Value               Date                      NA                       141                 02/11/2021                K                        3.9                 02/11/2021                CO2                      27                  02/11/2021                GLUCOSE                  107 (H)             02/11/2021                BUN                      12                  02/11/2021                CREATININE               0.91                02/11/2021                CALCIUM                  9.3                 02/11/2021                GFRNONAA                 >60                 02/11/2021          )       Anesthesia Quick Evaluation

## 2021-02-13 ENCOUNTER — Encounter (HOSPITAL_COMMUNITY): Payer: Self-pay | Admitting: Orthopedic Surgery

## 2021-02-13 ENCOUNTER — Inpatient Hospital Stay (HOSPITAL_COMMUNITY)
Admission: RE | Admit: 2021-02-13 | Discharge: 2021-02-15 | DRG: 455 | Disposition: A | Discharge status: Home / Self Care | Payer: Medicare Other | Attending: Orthopedic Surgery | Admitting: Orthopedic Surgery

## 2021-02-13 ENCOUNTER — Inpatient Hospital Stay (HOSPITAL_COMMUNITY): Payer: Medicare Other | Admitting: Vascular Surgery

## 2021-02-13 ENCOUNTER — Inpatient Hospital Stay (HOSPITAL_COMMUNITY): Payer: Medicare Other

## 2021-02-13 ENCOUNTER — Inpatient Hospital Stay (HOSPITAL_COMMUNITY): Payer: Medicare Other | Admitting: Anesthesiology

## 2021-02-13 ENCOUNTER — Encounter (HOSPITAL_COMMUNITY)
Admission: RE | Disposition: A | Discharge status: Home / Self Care | Payer: Self-pay | Source: Home / Self Care | Attending: Orthopedic Surgery

## 2021-02-13 DIAGNOSIS — Z96641 Presence of right artificial hip joint: Secondary | ICD-10-CM | POA: Diagnosis present

## 2021-02-13 DIAGNOSIS — Z419 Encounter for procedure for purposes other than remedying health state, unspecified: Secondary | ICD-10-CM

## 2021-02-13 DIAGNOSIS — M48061 Spinal stenosis, lumbar region without neurogenic claudication: Secondary | ICD-10-CM | POA: Diagnosis present

## 2021-02-13 DIAGNOSIS — M5187 Other intervertebral disc disorders, lumbosacral region: Secondary | ICD-10-CM | POA: Diagnosis not present

## 2021-02-13 DIAGNOSIS — M532X6 Spinal instabilities, lumbar region: Secondary | ICD-10-CM

## 2021-02-13 DIAGNOSIS — Z79899 Other long term (current) drug therapy: Secondary | ICD-10-CM | POA: Diagnosis not present

## 2021-02-13 DIAGNOSIS — M5416 Radiculopathy, lumbar region: Secondary | ICD-10-CM

## 2021-02-13 DIAGNOSIS — M4807 Spinal stenosis, lumbosacral region: Secondary | ICD-10-CM

## 2021-02-13 DIAGNOSIS — M4186 Other forms of scoliosis, lumbar region: Secondary | ICD-10-CM

## 2021-02-13 DIAGNOSIS — M5410 Radiculopathy, site unspecified: Secondary | ICD-10-CM | POA: Diagnosis present

## 2021-02-13 DIAGNOSIS — Z20822 Contact with and (suspected) exposure to covid-19: Secondary | ICD-10-CM | POA: Diagnosis present

## 2021-02-13 DIAGNOSIS — M4156 Other secondary scoliosis, lumbar region: Secondary | ICD-10-CM | POA: Diagnosis present

## 2021-02-13 DIAGNOSIS — M4316 Spondylolisthesis, lumbar region: Secondary | ICD-10-CM | POA: Diagnosis present

## 2021-02-13 HISTORY — PX: ANTERIOR LUMBAR FUSION: SHX1170

## 2021-02-13 HISTORY — PX: ABDOMINAL EXPOSURE: SHX5708

## 2021-02-13 HISTORY — PX: ANTERIOR LAT LUMBAR FUSION: SHX1168

## 2021-02-13 SURGERY — ANTERIOR LUMBAR FUSION 1 LEVEL
Anesthesia: General

## 2021-02-13 MED ORDER — ALUM & MAG HYDROXIDE-SIMETH 200-200-20 MG/5ML PO SUSP: 30.0000 mL | Freq: Four times a day (QID) | ORAL | Status: DC | PRN

## 2021-02-13 MED ORDER — LACTATED RINGERS IV SOLN: INTRAVENOUS | Status: DC | PRN

## 2021-02-13 MED ORDER — PROPOFOL 500 MG/50ML IV EMUL
INTRAVENOUS | Status: DC | PRN
  Administered 2021-02-13: 25 ug/kg/min via INTRAVENOUS
  Administered 2021-02-13: 50 ug/kg/min via INTRAVENOUS

## 2021-02-13 MED ORDER — ONDANSETRON HCL 4 MG/2ML IJ SOLN
INTRAMUSCULAR | Status: AC
  Filled 2021-02-13: qty 2

## 2021-02-13 MED ORDER — BUPIVACAINE LIPOSOME 1.3 % IJ SUSP
INTRAMUSCULAR | Status: AC
  Filled 2021-02-13: qty 20

## 2021-02-13 MED ORDER — FENTANYL CITRATE (PF) 250 MCG/5ML IJ SOLN
INTRAMUSCULAR | Status: AC
  Filled 2021-02-13: qty 5

## 2021-02-13 MED ORDER — OXYCODONE-ACETAMINOPHEN 5-325 MG PO TABS
1.0000 | ORAL_TABLET | ORAL | Status: DC | PRN
Start: 2021-02-13 — End: 2021-02-15
  Administered 2021-02-13 – 2021-02-15 (×8): 2 via ORAL
  Filled 2021-02-13 (×8): qty 2

## 2021-02-13 MED ORDER — PROMETHAZINE HCL 25 MG/ML IJ SOLN: 6.2500 mg | INTRAMUSCULAR | Status: DC | PRN

## 2021-02-13 MED ORDER — ATORVASTATIN CALCIUM 10 MG PO TABS
10.0000 mg | ORAL_TABLET | Freq: Every day | ORAL | Status: DC
  Administered 2021-02-15: 10 mg via ORAL
  Filled 2021-02-13: qty 1

## 2021-02-13 MED ORDER — ONDANSETRON HCL 4 MG/2ML IJ SOLN: 4.0000 mg | Freq: Four times a day (QID) | INTRAMUSCULAR | Status: DC | PRN

## 2021-02-13 MED ORDER — SENNOSIDES-DOCUSATE SODIUM 8.6-50 MG PO TABS: 1.0000 | ORAL_TABLET | Freq: Every evening | ORAL | Status: DC | PRN

## 2021-02-13 MED ORDER — CHLORHEXIDINE GLUCONATE CLOTH 2 % EX PADS: 6.0000 | MEDICATED_PAD | Freq: Once | CUTANEOUS | Status: DC

## 2021-02-13 MED ORDER — FENTANYL CITRATE (PF) 250 MCG/5ML IJ SOLN
INTRAMUSCULAR | Status: DC | PRN
  Administered 2021-02-13 (×5): 50 ug via INTRAVENOUS

## 2021-02-13 MED ORDER — HYDROCODONE-ACETAMINOPHEN 5-325 MG PO TABS: 1.0000 | ORAL_TABLET | ORAL | Status: DC | PRN

## 2021-02-13 MED ORDER — LIDOCAINE 2% (20 MG/ML) 5 ML SYRINGE
INTRAMUSCULAR | Status: AC
  Filled 2021-02-13: qty 5

## 2021-02-13 MED ORDER — METHOCARBAMOL 1000 MG/10ML IJ SOLN
500.0000 mg | Freq: Four times a day (QID) | INTRAVENOUS | Status: DC | PRN
  Filled 2021-02-13: qty 5

## 2021-02-13 MED ORDER — FLEET ENEMA 7-19 GM/118ML RE ENEM: 1.0000 | ENEMA | Freq: Once | RECTAL | Status: DC | PRN

## 2021-02-13 MED ORDER — ACETAMINOPHEN 325 MG PO TABS: 650.0000 mg | ORAL_TABLET | ORAL | Status: DC | PRN

## 2021-02-13 MED ORDER — POVIDONE-IODINE 7.5 % EX SOLN
Freq: Once | CUTANEOUS | Status: DC
  Filled 2021-02-13: qty 118

## 2021-02-13 MED ORDER — MIDAZOLAM HCL 2 MG/2ML IJ SOLN
INTRAMUSCULAR | Status: AC
  Filled 2021-02-13: qty 2

## 2021-02-13 MED ORDER — LACTATED RINGERS IV SOLN: INTRAVENOUS | Status: DC

## 2021-02-13 MED ORDER — THROMBIN 20000 UNITS EX KIT
PACK | CUTANEOUS | Status: AC
  Filled 2021-02-13: qty 1

## 2021-02-13 MED ORDER — PHENYLEPHRINE 40 MCG/ML (10ML) SYRINGE FOR IV PUSH (FOR BLOOD PRESSURE SUPPORT)
PREFILLED_SYRINGE | INTRAVENOUS | Status: AC
  Filled 2021-02-13: qty 10

## 2021-02-13 MED ORDER — 0.9 % SODIUM CHLORIDE (POUR BTL) OPTIME
TOPICAL | Status: DC | PRN
  Administered 2021-02-13: 09:00:00 1000 mL

## 2021-02-13 MED ORDER — PROPOFOL 10 MG/ML IV BOLUS
INTRAVENOUS | Status: AC
  Filled 2021-02-13: qty 20

## 2021-02-13 MED ORDER — BUPIVACAINE-EPINEPHRINE (PF) 0.25% -1:200000 IJ SOLN
INTRAMUSCULAR | Status: AC
  Filled 2021-02-13: qty 30

## 2021-02-13 MED ORDER — BUPIVACAINE-EPINEPHRINE 0.25% -1:200000 IJ SOLN
INTRAMUSCULAR | Status: DC | PRN
  Administered 2021-02-13: 30 mL

## 2021-02-13 MED ORDER — ACETAMINOPHEN 650 MG RE SUPP: 650.0000 mg | RECTAL | Status: DC | PRN

## 2021-02-13 MED ORDER — ONDANSETRON HCL 4 MG/2ML IJ SOLN
INTRAMUSCULAR | Status: DC | PRN
  Administered 2021-02-13: 4 mg via INTRAVENOUS

## 2021-02-13 MED ORDER — CEFAZOLIN SODIUM-DEXTROSE 2-4 GM/100ML-% IV SOLN
2.0000 g | INTRAVENOUS | Status: AC
  Administered 2021-02-13 (×2): 2 g via INTRAVENOUS
  Filled 2021-02-13: qty 100

## 2021-02-13 MED ORDER — CEFAZOLIN SODIUM 1 G IJ SOLR
INTRAMUSCULAR | Status: AC
  Filled 2021-02-13: qty 20

## 2021-02-13 MED ORDER — PHENOL 1.4 % MT LIQD: 1.0000 | OROMUCOSAL | Status: DC | PRN

## 2021-02-13 MED ORDER — DOCUSATE SODIUM 100 MG PO CAPS
100.0000 mg | ORAL_CAPSULE | Freq: Two times a day (BID) | ORAL | Status: DC
  Administered 2021-02-13 – 2021-02-15 (×3): 100 mg via ORAL
  Filled 2021-02-13 (×2): qty 1

## 2021-02-13 MED ORDER — ZOLPIDEM TARTRATE 5 MG PO TABS: 5.0000 mg | ORAL_TABLET | Freq: Every evening | ORAL | Status: DC | PRN

## 2021-02-13 MED ORDER — DEXMEDETOMIDINE (PRECEDEX) IN NS 20 MCG/5ML (4 MCG/ML) IV SYRINGE
PREFILLED_SYRINGE | INTRAVENOUS | Status: DC | PRN
  Administered 2021-02-13: 8 ug via INTRAVENOUS
  Administered 2021-02-13: 12 ug via INTRAVENOUS

## 2021-02-13 MED ORDER — FENTANYL CITRATE (PF) 100 MCG/2ML IJ SOLN
INTRAMUSCULAR | Status: AC
  Administered 2021-02-13: 15:00:00 50 ug via INTRAVENOUS
  Filled 2021-02-13: qty 2

## 2021-02-13 MED ORDER — CHLORHEXIDINE GLUCONATE 0.12 % MT SOLN
15.0000 mL | OROMUCOSAL | Status: AC
  Administered 2021-02-13: 07:00:00 15 mL via OROMUCOSAL
  Filled 2021-02-13 (×2): qty 15

## 2021-02-13 MED ORDER — MORPHINE SULFATE (PF) 2 MG/ML IV SOLN
1.0000 mg | INTRAVENOUS | Status: DC | PRN
  Administered 2021-02-13: 17:00:00 2 mg via INTRAVENOUS
  Filled 2021-02-13: qty 1

## 2021-02-13 MED ORDER — MIDAZOLAM HCL 2 MG/2ML IJ SOLN
INTRAMUSCULAR | Status: DC | PRN
  Administered 2021-02-13: 2 mg via INTRAVENOUS

## 2021-02-13 MED ORDER — POTASSIUM CHLORIDE IN NACL 20-0.9 MEQ/L-% IV SOLN: INTRAVENOUS | Status: DC

## 2021-02-13 MED ORDER — CELECOXIB 200 MG PO CAPS
200.0000 mg | ORAL_CAPSULE | Freq: Once | ORAL | Status: AC
  Administered 2021-02-13: 07:00:00 200 mg via ORAL
  Filled 2021-02-13: qty 1

## 2021-02-13 MED ORDER — MENTHOL 3 MG MT LOZG: 1.0000 | LOZENGE | OROMUCOSAL | Status: DC | PRN

## 2021-02-13 MED ORDER — FENTANYL CITRATE (PF) 100 MCG/2ML IJ SOLN
25.0000 ug | INTRAMUSCULAR | Status: DC | PRN
  Administered 2021-02-13: 16:00:00 50 ug via INTRAVENOUS

## 2021-02-13 MED ORDER — PHENYLEPHRINE 40 MCG/ML (10ML) SYRINGE FOR IV PUSH (FOR BLOOD PRESSURE SUPPORT)
PREFILLED_SYRINGE | INTRAVENOUS | Status: DC | PRN
  Administered 2021-02-13 (×2): 80 ug via INTRAVENOUS
  Administered 2021-02-13: 40 ug via INTRAVENOUS
  Administered 2021-02-13: 80 ug via INTRAVENOUS
  Administered 2021-02-13: 40 ug via INTRAVENOUS
  Administered 2021-02-13: 80 ug via INTRAVENOUS

## 2021-02-13 MED ORDER — METHOCARBAMOL 500 MG PO TABS
500.0000 mg | ORAL_TABLET | Freq: Four times a day (QID) | ORAL | Status: DC | PRN
  Administered 2021-02-13 – 2021-02-15 (×4): 500 mg via ORAL
  Filled 2021-02-13 (×4): qty 1

## 2021-02-13 MED ORDER — PHENYLEPHRINE HCL-NACL 20-0.9 MG/250ML-% IV SOLN
INTRAVENOUS | Status: DC | PRN
  Administered 2021-02-13: 150 ug/min via INTRAVENOUS

## 2021-02-13 MED ORDER — ROCURONIUM BROMIDE 10 MG/ML (PF) SYRINGE
PREFILLED_SYRINGE | INTRAVENOUS | Status: DC | PRN
  Administered 2021-02-13: 30 mg via INTRAVENOUS
  Administered 2021-02-13: 20 mg via INTRAVENOUS
  Administered 2021-02-13: 70 mg via INTRAVENOUS

## 2021-02-13 MED ORDER — ROCURONIUM BROMIDE 10 MG/ML (PF) SYRINGE
PREFILLED_SYRINGE | INTRAVENOUS | Status: AC
  Filled 2021-02-13: qty 10

## 2021-02-13 MED ORDER — SODIUM CHLORIDE 0.9 % IV SOLN
250.0000 mL | INTRAVENOUS | Status: DC
  Administered 2021-02-13: 17:00:00 250 mL via INTRAVENOUS

## 2021-02-13 MED ORDER — SODIUM CHLORIDE 0.9% FLUSH: 3.0000 mL | Freq: Two times a day (BID) | INTRAVENOUS | Status: DC

## 2021-02-13 MED ORDER — PROPOFOL 1000 MG/100ML IV EMUL
INTRAVENOUS | Status: AC
  Filled 2021-02-13: qty 100

## 2021-02-13 MED ORDER — BISACODYL 5 MG PO TBEC
5.0000 mg | DELAYED_RELEASE_TABLET | Freq: Every day | ORAL | Status: DC | PRN
  Administered 2021-02-15: 5 mg via ORAL
  Filled 2021-02-13: qty 1

## 2021-02-13 MED ORDER — PROPOFOL 10 MG/ML IV BOLUS
INTRAVENOUS | Status: DC | PRN
  Administered 2021-02-13: 50 mg via INTRAVENOUS
  Administered 2021-02-13: 200 mg via INTRAVENOUS

## 2021-02-13 MED ORDER — SUGAMMADEX SODIUM 200 MG/2ML IV SOLN
INTRAVENOUS | Status: DC | PRN
  Administered 2021-02-13: 200 mg via INTRAVENOUS

## 2021-02-13 MED ORDER — SODIUM CHLORIDE 0.9% FLUSH: 3.0000 mL | INTRAVENOUS | Status: DC | PRN

## 2021-02-13 MED ORDER — ONDANSETRON HCL 4 MG PO TABS: 4.0000 mg | ORAL_TABLET | Freq: Four times a day (QID) | ORAL | Status: DC | PRN

## 2021-02-13 MED ORDER — LIDOCAINE 2% (20 MG/ML) 5 ML SYRINGE
INTRAMUSCULAR | Status: DC | PRN
  Administered 2021-02-13: 80 mg via INTRAVENOUS

## 2021-02-13 MED ORDER — THROMBIN 20000 UNITS EX SOLR
CUTANEOUS | Status: DC | PRN
  Administered 2021-02-13: 20000 [IU] via TOPICAL

## 2021-02-13 MED ORDER — ACETAMINOPHEN 500 MG PO TABS
1000.0000 mg | ORAL_TABLET | Freq: Once | ORAL | Status: AC
  Administered 2021-02-13: 07:00:00 1000 mg via ORAL
  Filled 2021-02-13: qty 2

## 2021-02-13 MED ORDER — CEFAZOLIN SODIUM-DEXTROSE 2-4 GM/100ML-% IV SOLN
2.0000 g | Freq: Three times a day (TID) | INTRAVENOUS | Status: AC
  Administered 2021-02-13 – 2021-02-14 (×2): 2 g via INTRAVENOUS
  Filled 2021-02-13 (×2): qty 100

## 2021-02-13 SURGICAL SUPPLY — 116 items
APPLIER CLIP 11 MED OPEN (CLIP) ×6
BAG COUNTER SPONGE SURGICOUNT (BAG) ×9 IMPLANT
BATTALION LLIF ITRADISCAL SHIM (MISCELLANEOUS) ×3
BENZOIN TINCTURE PRP APPL 2/3 (GAUZE/BANDAGES/DRESSINGS) ×2 IMPLANT
BLADE CLIPPER SURG (BLADE) IMPLANT
BLADE SURG 10 STRL SS (BLADE) ×6 IMPLANT
CLIP APPLIE 11 MED OPEN (CLIP) ×4 IMPLANT
CLIP SPRING STIM LLIF SAFEOP (CLIP) ×1 IMPLANT
CLIP VESOCCLUDE MED 24/CT (CLIP) ×3 IMPLANT
CLIP VESOCCLUDE SM WIDE 24/CT (CLIP) ×3 IMPLANT
CLSR STERI-STRIP ANTIMIC 1/2X4 (GAUZE/BANDAGES/DRESSINGS) ×1 IMPLANT
CORD BIPOLAR FORCEPS 12FT (ELECTRODE) ×3 IMPLANT
COVER BACK TABLE 60X90IN (DRAPES) ×3 IMPLANT
COVER BACK TABLE 80X110 HD (DRAPES) ×3 IMPLANT
COVER SURGICAL LIGHT HANDLE (MISCELLANEOUS) ×6 IMPLANT
DERMABOND ADVANCED (GAUZE/BANDAGES/DRESSINGS) ×1
DERMABOND ADVANCED .7 DNX12 (GAUZE/BANDAGES/DRESSINGS) ×2 IMPLANT
DILATOR INSULATED LLIF 8-13-18 (NEUROSURGERY SUPPLIES) ×1 IMPLANT
DRAPE C-ARM 42X72 X-RAY (DRAPES) ×9 IMPLANT
DRAPE C-ARMOR (DRAPES) ×3 IMPLANT
DRAPE INCISE IOBAN 66X45 STRL (DRAPES) ×3 IMPLANT
DRAPE POUCH INSTRU U-SHP 10X18 (DRAPES) ×6 IMPLANT
DRAPE SURG 17X23 STRL (DRAPES) ×21 IMPLANT
DRSG MEPILEX BORDER 4X12 (GAUZE/BANDAGES/DRESSINGS) ×3 IMPLANT
DURAPREP 26ML APPLICATOR (WOUND CARE) ×6 IMPLANT
ELECT BLADE 4.0 EZ CLEAN MEGAD (MISCELLANEOUS) ×3
ELECT BLADE 6.5 EXT (BLADE) ×3 IMPLANT
ELECT CAUTERY BLADE 6.4 (BLADE) ×6 IMPLANT
ELECT KIT SAFEOP SSEP/SURF (KITS) ×3
ELECT REM PT RETURN 9FT ADLT (ELECTROSURGICAL) ×6
ELECTRODE BLDE 4.0 EZ CLN MEGD (MISCELLANEOUS) ×2 IMPLANT
ELECTRODE KT SAFEOP SSEP/SURF (KITS) IMPLANT
ELECTRODE REM PT RTRN 9FT ADLT (ELECTROSURGICAL) ×4 IMPLANT
GAUZE 4X4 16PLY ~~LOC~~+RFID DBL (SPONGE) ×3 IMPLANT
GAUZE SPONGE 4X4 12PLY STRL (GAUZE/BANDAGES/DRESSINGS) ×1 IMPLANT
GAUZE SPONGE 4X4 12PLY STRL LF (GAUZE/BANDAGES/DRESSINGS) ×1 IMPLANT
GLOVE SRG 8 PF TXTR STRL LF DI (GLOVE) ×8 IMPLANT
GLOVE SURG ENC MOIS LTX SZ6.5 (GLOVE) ×9 IMPLANT
GLOVE SURG ENC MOIS LTX SZ8 (GLOVE) ×6 IMPLANT
GLOVE SURG POLYISO LF SZ7.5 (GLOVE) ×3 IMPLANT
GLOVE SURG UNDER POLY LF SZ7 (GLOVE) ×6 IMPLANT
GLOVE SURG UNDER POLY LF SZ8 (GLOVE) ×4
GOWN STRL REUS W/ TWL LRG LVL3 (GOWN DISPOSABLE) ×10 IMPLANT
GOWN STRL REUS W/ TWL XL LVL3 (GOWN DISPOSABLE) ×8 IMPLANT
GOWN STRL REUS W/TWL LRG LVL3 (GOWN DISPOSABLE) ×5
GOWN STRL REUS W/TWL XL LVL3 (GOWN DISPOSABLE) ×4
GUIDEWIRE LLIF TT 310 (WIRE) ×1 IMPLANT
HEMOSTAT SURGICEL 2X14 (HEMOSTASIS) IMPLANT
INSERT FOGARTY 61MM (MISCELLANEOUS) IMPLANT
INSERT FOGARTY SM (MISCELLANEOUS) IMPLANT
IV CATH 14GX2 1/4 (CATHETERS) ×3 IMPLANT
KIT BASIN OR (CUSTOM PROCEDURE TRAY) ×6 IMPLANT
KIT TURNOVER KIT B (KITS) ×9 IMPLANT
KNIFE ANNULOTOMY (BLADE) ×1 IMPLANT
LOOP VESSEL MAXI BLUE (MISCELLANEOUS) ×3 IMPLANT
LOOP VESSEL MINI RED (MISCELLANEOUS) ×3 IMPLANT
MARKER SKIN DUAL TIP RULER LAB (MISCELLANEOUS) ×3 IMPLANT
MIX DBX 20CC MTF (Putty) ×1 IMPLANT
NDL HYPO 25GX1X1/2 BEV (NEEDLE) ×4 IMPLANT
NDL SPNL 18GX3.5 QUINCKE PK (NEEDLE) ×4 IMPLANT
NEEDLE HYPO 25GX1X1/2 BEV (NEEDLE) ×6 IMPLANT
NEEDLE SPNL 18GX3.5 QUINCKE PK (NEEDLE) ×6 IMPLANT
NS IRRIG 1000ML POUR BTL (IV SOLUTION) ×9 IMPLANT
PACK LAMINECTOMY ORTHO (CUSTOM PROCEDURE TRAY) ×6 IMPLANT
PACK UNIVERSAL I (CUSTOM PROCEDURE TRAY) ×6 IMPLANT
PAD ARMBOARD 7.5X6 YLW CONV (MISCELLANEOUS) ×18 IMPLANT
PROBE BALL TIP LLIF SAFEOP (NEUROSURGERY SUPPLIES) ×1 IMPLANT
PUTTY BONE DBX 5CC MIX (Putty) ×1 IMPLANT
SHIM ITRADISCAL BATTALION LLIF (MISCELLANEOUS) IMPLANT
SPACER ALIF EXP MAG 29X39X12 1 (Spacer) ×1 IMPLANT
SPACER TRANSCEND 8X18X60 10D (Spacer) ×2 IMPLANT
SPONGE INTESTINAL PEANUT (DISPOSABLE) ×15 IMPLANT
SPONGE SURGIFOAM ABS GEL 100 (HEMOSTASIS) ×6 IMPLANT
SPONGE T-LAP 18X18 ~~LOC~~+RFID (SPONGE) IMPLANT
SPONGE T-LAP 4X18 ~~LOC~~+RFID (SPONGE) ×3 IMPLANT
STAPLER VISISTAT 35W (STAPLE) ×3 IMPLANT
STRIP CLOSURE SKIN 1/2X4 (GAUZE/BANDAGES/DRESSINGS) ×1 IMPLANT
SURGIFLO W/THROMBIN 8M KIT (HEMOSTASIS) IMPLANT
SUT MNCRL AB 4-0 PS2 18 (SUTURE) ×9 IMPLANT
SUT PDS AB 1 CTX 36 (SUTURE) ×9 IMPLANT
SUT PROLENE 4 0 RB 1 (SUTURE) ×4
SUT PROLENE 4-0 RB1 .5 CRCL 36 (SUTURE) ×8 IMPLANT
SUT PROLENE 5 0 C 1 24 (SUTURE) IMPLANT
SUT PROLENE 5 0 CC1 (SUTURE) IMPLANT
SUT PROLENE 6 0 C 1 30 (SUTURE) ×3 IMPLANT
SUT PROLENE 6 0 CC (SUTURE) IMPLANT
SUT SILK 0 TIES 10X30 (SUTURE) ×3 IMPLANT
SUT SILK 2 0 TIES 10X30 (SUTURE) ×6 IMPLANT
SUT SILK 2 0SH CR/8 30 (SUTURE) IMPLANT
SUT SILK 3 0 TIES 10X30 (SUTURE) ×6 IMPLANT
SUT SILK 3 0SH CR/8 30 (SUTURE) IMPLANT
SUT VIC AB 0 CT1 18XCR BRD 8 (SUTURE) ×2 IMPLANT
SUT VIC AB 0 CT1 27 (SUTURE) ×1
SUT VIC AB 0 CT1 27XBRD ANBCTR (SUTURE) ×2 IMPLANT
SUT VIC AB 0 CT1 8-18 (SUTURE) ×1
SUT VIC AB 1 CT1 18XCR BRD 8 (SUTURE) ×2 IMPLANT
SUT VIC AB 1 CT1 27 (SUTURE) ×2
SUT VIC AB 1 CT1 27XBRD ANBCTR (SUTURE) ×4 IMPLANT
SUT VIC AB 1 CT1 8-18 (SUTURE) ×1
SUT VIC AB 1 CTX 36 (SUTURE) ×2
SUT VIC AB 1 CTX36XBRD ANBCTR (SUTURE) ×4 IMPLANT
SUT VIC AB 2-0 CT1 27 (SUTURE) ×1
SUT VIC AB 2-0 CT1 TAPERPNT 27 (SUTURE) ×2 IMPLANT
SUT VIC AB 2-0 CT2 18 VCP726D (SUTURE) ×7 IMPLANT
SUT VIC AB 3-0 SH 27 (SUTURE) ×1
SUT VIC AB 3-0 SH 27X BRD (SUTURE) ×2 IMPLANT
SYR BULB IRRIG 60ML STRL (SYRINGE) ×6 IMPLANT
SYR CONTROL 10ML LL (SYRINGE) ×3 IMPLANT
TAPE CLOTH SURG 4X10 WHT LF (GAUZE/BANDAGES/DRESSINGS) ×1 IMPLANT
TOWEL GREEN STERILE (TOWEL DISPOSABLE) ×9 IMPLANT
TOWEL GREEN STERILE FF (TOWEL DISPOSABLE) ×6 IMPLANT
TRAY FOLEY MTR SLVR 16FR STAT (SET/KITS/TRAYS/PACK) ×3 IMPLANT
TRAY FOLEY W/BAG SLVR 16FR (SET/KITS/TRAYS/PACK) ×1
TRAY FOLEY W/BAG SLVR 16FR ST (SET/KITS/TRAYS/PACK) ×2 IMPLANT
WATER STERILE IRR 1000ML POUR (IV SOLUTION) ×6 IMPLANT
YANKAUER SUCT BULB TIP NO VENT (SUCTIONS) ×6 IMPLANT

## 2021-02-13 NOTE — Op Note (Signed)
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  Indications: This is a 64 year old gentleman with significant degenerative back disease.  Been scheduled for anterior exposure L5-S1.  I discussed the details of the operation including risks and benefits at his preoperative visit.  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  He was willing to proceed.  Procedure:  The patient was identified in the holding area and taken to [facility] OR ROOM 05  The patient was then placed supine on the table. general anesthesia was administered.  The patient was prepped and draped in the usual sterile fashion.  A time out was called and antibiotics were administered.  An assistant was necessary for aiding with exposure and other technical details.  The L5-S1 disc space was identified by fluoroscopy.  The appropriate level and skin incision was marked for.  Next, a left lower quadrant transverse incision was made with a 10 blade.  Cautery was used divide subcutaneous tissue down to the abdominal wall fascia which was opened with cautery.  Subfascial flaps were then raised.  I then entered the retroperitoneal space, lateral to the rectus muscle.  The iliac artery was identified and skeletonized on its anterior surface.  Next the external iliac vein was identified and fully mobilized.  Wiley retractors were used to reflect the abdominal contents cephalad and medial.  I then identified the bone.  The patient had a very large median sacral vein directly arising from the left common iliac vein.  I confirmed that this  was not a part of the kidney.  I then divided between silk ties.  Iliac vein was then fully mobilized.  The Thompson retractor was then set up.  I placed 200 reverse lip blades on either side of the spine.  Malleable retractors were placed superiorly and inferiorly.  Fluoroscopy returned to confirm that we were at the appropriate level.  At this point Dr. Yevette Edwards took over the rest of the procedure.  Please see his detailed operative note.  He performed closure of the incision.   Disposition: No immediate complications.   Juleen China, M.D., [facility] [place] Office: [phone] Pager:  [phone]

## 2021-02-13 NOTE — Anesthesia Procedure Notes (Signed)
Procedure Name: Intubation Date/Time: 02/13/2021 9:05 AM Performed by: Shary Decamp, CRNA Pre-anesthesia Checklist: Patient identified, Patient being monitored, Timeout performed, Emergency Drugs available and Suction available Patient Re-evaluated:Patient Re-evaluated prior to induction Oxygen Delivery Method: Circle system utilized Preoxygenation: Pre-oxygenation with 100% oxygen Induction Type: IV induction Ventilation: Mask ventilation without difficulty Laryngoscope Size: 3 and Miller Grade View: Grade I Tube type: Oral Tube size: 8.0 mm Number of attempts: 1 Airway Equipment and Method: Stylet Placement Confirmation: ETT inserted through vocal cords under direct vision, positive ETCO2 and breath sounds checked- equal and bilateral Secured at: 22 cm Tube secured with: Tape Dental Injury: Teeth and Oropharynx as per pre-operative assessment

## 2021-02-13 NOTE — Transfer of Care (Signed)
Immediate Anesthesia Transfer of Care Note  Patient: Casey Chase  Procedure(s) Performed: ANTERIOR LUMBAR INTERBODY FUSION LUMBAR 5 - SACRUM 1 WITH INSTRUMENTATION AND ALLOGRAFT LUMBAR 3 - LUMBAR 4, LUMBAR 4- LUMBAR 5 LEFT LATERAL INTERBODY FUSION WITH INSTRUMENTATION AND ALLOGRAFT (Left) ABDOMINAL EXPOSURE  Patient Location: PACU  Anesthesia Type:General  Level of Consciousness: drowsy, patient cooperative and responds to stimulation  Airway & Oxygen Therapy: Patient Spontanous Breathing and Patient connected to face mask oxygen  Post-op Assessment: Report given to RN and Post -op Vital signs reviewed and stable  Post vital signs: Reviewed and stable  Last Vitals:  Vitals Value Taken Time  BP 124/75 02/13/21 1442  Temp    Pulse 85 02/13/21 1442  Resp 16 02/13/21 1442  SpO2 100 % 02/13/21 1442    Last Pain:  Vitals:   02/13/21 0642  TempSrc:   PainSc: 0-No pain         Complications: No notable events documented.

## 2021-02-13 NOTE — H&P (Signed)
° ° ° °  PREOPERATIVE H&P  Chief Complaint: Bilateral leg pain  HPI: Casey Chase is a 65 y.o. 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   Procedure: RIGHT TOTAL HIP ARTHROPLASTY ANTERIOR APPROACH;  Surgeon: Durene Romans, MD;  Location: [facility] ORS;  Service: Orthopedics;  Laterality: Right;   Social History   Socioeconomic History   Marital status: Married    Spouse name: Not on file   Number of children: Not on file   Years of education: Not on file   Highest education level: Not on file  Occupational History   Not on file  Tobacco Use   Smoking status: Former   Smokeless tobacco: Never  Vaping Use   Vaping Use: Never used  Substance and Sexual Activity   Alcohol use: Yes    Alcohol/week: 12.0 standard drinks    Types: 12 Cans of beer per week   Drug use: No   Sexual activity: Not on file  Other Topics Concern   Not on file  Social History Narrative   Not on file   Social Determinants of Health   Financial Resource Strain: Not on file  Food Insecurity: Not on file  Transportation Needs: Not on file  Physical Activity: Not on file  Stress: Not on file  Social Connections: Not on file   History reviewed. No pertinent family history. No Known Allergies Prior to Admission medications   Medication Sig Start Date End Date Taking? Authorizing Provider  atorvastatin (LIPITOR) 10 MG tablet Take 10 mg by mouth daily. 10/22/20  Yes [provider]  naproxen sodium (ALEVE) 220 MG  tablet Take 220 mg by mouth 2 (two) times daily as needed (pain).   Yes [provider]  tiZANidine (ZANAFLEX) 4 MG tablet Take 4 mg by mouth every 8 (eight) hours as needed for muscle spasms. 09/20/20  Yes [provider]     All other systems have been reviewed and were otherwise negative with the exception of those mentioned in the HPI and as above.  Physical Exam: Vitals:   02/13/21 0636  BP: (!) 148/86  Pulse: 73  Resp: 18  Temp: 98.1 F (36.7 C)  SpO2: 97%    Body mass index is 32.89 kg/m.  General: Alert, no acute distress Cardiovascular: No pedal edema Respiratory: No cyanosis, no use of accessory musculature Skin: No lesions in the area of chief complaint Neurologic: Sensation intact distally Psychiatric: Patient is competent for consent with normal mood and affect Lymphatic: No axillary or cervical lymphadenopathy   Assessment/Plan: SEVERE SPINAL STENOSIS, LUMBAR RADICULOPATHY Plan for Procedure(s): ANTERIOR LUMBAR INTERBODY FUSION LUMBAR 5 - SACRUM 1 WITH INSTRUMENTATION AND ALLOGRAFT LUMBAR 3 - LUMBAR 4, LUMBAR 4- LUMBAR 5 LEFT LATERAL INTERBODY FUSION WITH INSTRUMENTATION AND ALLOGRAFT ABDOMINAL EXPOSURE   Jackelyn Hoehn, MD 02/13/2021 7:48 AM

## 2021-02-13 NOTE — Anesthesia Postprocedure Evaluation (Signed)
Anesthesia Post Note  Patient: Casey Chase  Procedure(s) Performed: ANTERIOR LUMBAR INTERBODY FUSION LUMBAR 5 - SACRUM 1 WITH INSTRUMENTATION AND ALLOGRAFT LUMBAR 3 - LUMBAR 4, LUMBAR 4- LUMBAR 5 LEFT LATERAL INTERBODY FUSION WITH INSTRUMENTATION AND ALLOGRAFT (Left) ABDOMINAL EXPOSURE     Patient location during evaluation: PACU Anesthesia Type: General Level of consciousness: awake and alert Pain management: pain level controlled Vital Signs Assessment: post-procedure vital signs reviewed and stable Respiratory status: spontaneous breathing, nonlabored ventilation, respiratory function stable and patient connected to nasal cannula oxygen Cardiovascular status: blood pressure returned to baseline and stable Postop Assessment: no apparent nausea or vomiting Anesthetic complications: no   No notable events documented.  Last Vitals:  Vitals:   02/13/21 1615 02/13/21 1642  BP: 126/75 (!) 144/82  Pulse: 74 77  Resp: 16 18  Temp:    SpO2: 100% 100%    Last Pain:  Vitals:   02/13/21 1545  TempSrc:   PainSc: 8                  Artesia Berkey P Jahmiyah Dullea

## 2021-02-13 NOTE — Interval H&P Note (Signed)
History and Physical Interval Note:  02/13/2021 8:13 AM  Casey Chase  has presented today for surgery, with the diagnosis of SEVERE SPINAL STENOSIS.  The various methods of treatment have been discussed with the patient and family. After consideration of risks, benefits and other options for treatment, the patient has consented to  Procedure(s): ANTERIOR LUMBAR INTERBODY FUSION LUMBAR 5 - SACRUM 1 WITH INSTRUMENTATION AND ALLOGRAFT (N/A) LUMBAR 3 - LUMBAR 4, LUMBAR 4- LUMBAR 5 LEFT LATERAL INTERBODY FUSION WITH INSTRUMENTATION AND ALLOGRAFT (Left) ABDOMINAL EXPOSURE (N/A) as a surgical intervention.  The patient's history has been reviewed, patient examined, no change in status, stable for surgery.  I have reviewed the patient's chart and labs.  Questions were answered to the patient's satisfaction.     Durene Cal

## 2021-02-13 NOTE — Op Note (Signed)
PATIENT NAME: Casey Chase   MEDICAL RECORD NO.:   [id number]    DATE OF BIRTH: April 12, 1955   DATE OF PROCEDURE: 02/13/2021                               OPERATIVE REPORT   PREOPERATIVE DIAGNOSES: 1. Bilateral lumbar radiculopathy (M54.16). 2. Severe spinal stenosis spanning L3-S1 3. Spinal instability, including spondylolisthesis x2 and a degenerative lumbar scoliosis (M53.2X6, M41.86)  POSTOPERATIVE DIAGNOSES: 1. Bilateral lumbar radiculopathy (M54.16). 2. Severe spinal stenosis spanning L3-S1 3. Spinal instability, including spondylolisthesis x2 and a degenerative lumbar scoliosis (M53.2X6, M41.86)  PROCEDURE: 1. Anterior lumbar interbody fusion, L5-S1 2. Left lateral lumbar interbody fusion, L3/4, L4/5 3. Insertion of interbody device x 3 (Globus spacer at L5/S1, Alphatec spacers placed at L3/4 and L4/5). 4. Intraoperative use of fluoroscopy. 5. Use of morselized allograft - DBX-mix 6. Co-surgeon with Dr. Nada Libman for retroperitoneal exposure  SURGEON:  Estill Bamberg, MD  ASSISTANT:  Jason Coop, PA-C  ANESTHESIA:  General endotracheal anesthesia.  COMPLICATIONS:  None.  DISPOSITION:  Stable.  ESTIMATED BLOOD LOSS:  Minimal  INDICATIONS FOR SURGERY:  Briefly, Mr. Gigante is a very pleasant 65 year old male, who has been having progressive debilitating pain in the bilateral legs.  The patient did fail appropriate nonoperative measures, but did continue to have significant pain due to the findings noted above. Given his ongoing pain and dysfunction, we did discuss proceeding with the procedure noted above in a 2-staged fashion.  The patient was fully aware of the risks and limitations associated with surgery, and did wish to proceed.     OPERATIVE DETAILS:  On 02/13/2021, the patient was brought to surgery and general endotracheal anesthesia was administered.  The patient was placed supine on the hospital bed.  The patient's abdomen was prepped and draped  in the usual sterile fashion.  An anterior retroperitoneal approach was then performed by myself and Dr. Nada Libman.  Once the anterior lumbar spine was noted, we did focus our attention on the L5-S1 intervertebral space.  I then performed a thorough and complete L5-S1 intervertebral diskectomy to the level of the posterior longitudinal ligament.  I was very pleased with the diskectomy that I was able to accomplish.  The endplates were then appropriately prepared and the appropriate sized anterior intervertebral spacer (large, 15 degrees of lordosis) was packed with DBX-mix and tamped into position. This was then expanded to 14.55 mm in height. I was very pleased with the press-fit of the implant.  I did liberally use AP and lateral fluoroscopy to ensure that the implant was in the appropriate position, and was very pleased with the radiographs. The wound was copiously irrigated.  The fascia was  closed using #1 PDS.  The subcutaneous layer was closed using 0 Vicryl followed by 2-0 Vicryl, and the skin was closed using 4-0 Monocryl. Benzoin and Steri-Strips were applied followed by sterile dressing.   All instrument counts were correct at the termination of the procedure.   The patient was then placed in the lateral decubitus position, with the left side up.  Neurologic monitoring leads were placed by the monitoring technician.  The patient's torso and lower extremities were secured to the bed.  The patient's hips and knees were flexed in order to lessen the tension on the psoas musculature.  The left flank was then prepped and draped in the usual sterile fashion.  The bed was  flexed, in order to optimize exposure to the L4-5 intervertebral space.  After a timeout procedure was performed, a left-sided transverse incision was made over the left flank overlying the L3-4 and L4-5 intervertebral spaces.  The  superior aspect of the iliac crest was in the region of the L4-5 intervertebral space.  I did  dissect just above the iliac crest, and through the external and internal oblique musculature, and transversalis musculature and fascia.  The retroperitoneal space was encountered.  The peritoneum was bluntly swept anteriorly, and the psoas was readily identified.  I did use a series of dilators to dock over the L4-5 intervertebral space.  I did use neurologic monitoring while placing the dilators, in order to ensure that there were no neurologic structures in the immediate vicinity of the dilators.  The lumbar plexus was noted to be posterior.  A self-retaining retractor was placed, and was attached to a rigid arm.  The retractor was very gently dilated and a shim was placed into the L4/5 intervertebral space.  I then used a knife to perform an annulotomy at the lateral aspect of the L4-5 intervertebral space.  I then used a series of curettes and pituitary rongeurs in order to perform a thorough and complete L4-5 intervertebral diskectomy.  The contralateral annulus was released.  I then placed a series of intervertebral spacer trials, and I did feel that a lordotic 33mm x 18 mm x 60 mm spacer would be the most appropriate fit. The appropriate spacer was then packed with DBX-mix and tamped into position.  I was very pleased with the final resting position of the intervertebral spacer.  I was very pleased with the AP and lateral fluoroscopic images and the excellent restoration of disk height identified on both AP and lateral images.    At this point, the retractor was removed, and once again, the peritoneum was swept anteriorly, and a series of dilators were docked over the L3-4 intervertebral space. I did use neurologic monitoring while placing the dilators, in order to ensure that there were no neurologic structures in the immediate vicinity of the dilators.  The lumbar plexus was noted to be posterior.  A self-retaining retractor was placed, and was attached to a rigid arm.  The retractor was very gently  dilated and a shim was placed into the L3/4 intervertebral space.  I then used a knife to perform an annulotomy at the lateral aspect of the L3/4 intervertebral space.  I then used a series of curettes and pituitary rongeurs in order to perform a thorough and complete L3/4 intervertebral diskectomy.  The contralateral annulus was released.  I then placed a series of intervertebral spacer trials, and I did feel that a lordotic 32mm x 18 mm x 60 mm spacer would be the most appropriate fit. The appropriate spacer was then packed with DBX-mix and tamped into position.  I was very pleased with the final resting position of the intervertebral spacer.  I was very pleased with the final AP and lateral fluoroscopic images and the excellent restoration of disk height identified on both AP and lateral images.     Of note, I did use neurologic monitoring throughout the entire surgery, and there was no abnormal EMG activity noted throughout the entire surgery.  At this point, the wound was copiously irrigated.  The fascia, internal, and external oblique musculature was closed using #1 Vicryl.  The subcutaneous layer was  closed using 2-0 Vicryl and the skin was closed using 4-0 Monocryl.  Benzoin and Steri-Strips were applied followed by sterile dressing.  All instrument counts were correct at the termination of the procedure.   Of note, Jason Coop, PA-C was my assistant throughout surgery, and did aid in retraction, suctioning, placement of the hardware, and closure for both the anterior and lateral portions of the procedure.   Estill Bamberg, MD

## 2021-02-14 ENCOUNTER — Encounter (HOSPITAL_COMMUNITY)
Admission: RE | Disposition: A | Discharge status: Home / Self Care | Payer: Self-pay | Source: Home / Self Care | Attending: Orthopedic Surgery

## 2021-02-14 ENCOUNTER — Inpatient Hospital Stay (HOSPITAL_COMMUNITY): Payer: Medicare Other

## 2021-02-14 ENCOUNTER — Inpatient Hospital Stay (HOSPITAL_COMMUNITY): Payer: Medicare Other | Admitting: Certified Registered"

## 2021-02-14 ENCOUNTER — Inpatient Hospital Stay (HOSPITAL_COMMUNITY): Admission: RE | Admit: 2021-02-14 | Payer: Medicare Other | Source: Ambulatory Visit | Admitting: Orthopedic Surgery

## 2021-02-14 HISTORY — PX: POSTERIOR LUMBAR FUSION 4 LEVEL: SHX6037

## 2021-02-14 SURGERY — POSTERIOR LUMBAR FUSION 4 LEVEL
Anesthesia: General | Site: Spine Lumbar

## 2021-02-14 MED ORDER — BUPIVACAINE LIPOSOME 1.3 % IJ SUSP
INTRAMUSCULAR | Status: DC | PRN
  Administered 2021-02-14: 20 mL

## 2021-02-14 MED ORDER — CEFAZOLIN SODIUM-DEXTROSE 2-3 GM-%(50ML) IV SOLR
INTRAVENOUS | Status: DC | PRN
  Administered 2021-02-14: 2 g via INTRAVENOUS

## 2021-02-14 MED ORDER — ROCURONIUM BROMIDE 10 MG/ML (PF) SYRINGE
PREFILLED_SYRINGE | INTRAVENOUS | Status: AC
  Filled 2021-02-14: qty 10

## 2021-02-14 MED ORDER — PROPOFOL 10 MG/ML IV BOLUS
INTRAVENOUS | Status: AC
  Filled 2021-02-14: qty 20

## 2021-02-14 MED ORDER — SUGAMMADEX SODIUM 200 MG/2ML IV SOLN
INTRAVENOUS | Status: DC | PRN
  Administered 2021-02-14: 200 mg via INTRAVENOUS

## 2021-02-14 MED ORDER — GLYCOPYRROLATE 0.2 MG/ML IJ SOLN
INTRAMUSCULAR | Status: DC | PRN
  Administered 2021-02-14: .2 mg via INTRAVENOUS

## 2021-02-14 MED ORDER — FENTANYL CITRATE (PF) 100 MCG/2ML IJ SOLN
INTRAMUSCULAR | Status: AC
  Filled 2021-02-14: qty 2

## 2021-02-14 MED ORDER — FENTANYL CITRATE (PF) 250 MCG/5ML IJ SOLN
INTRAMUSCULAR | Status: DC | PRN
  Administered 2021-02-14 (×5): 50 ug via INTRAVENOUS

## 2021-02-14 MED ORDER — LIDOCAINE 2% (20 MG/ML) 5 ML SYRINGE
INTRAMUSCULAR | Status: AC
  Filled 2021-02-14: qty 5

## 2021-02-14 MED ORDER — MIDAZOLAM HCL 2 MG/2ML IJ SOLN
INTRAMUSCULAR | Status: DC | PRN
  Administered 2021-02-14: 2 mg via INTRAVENOUS

## 2021-02-14 MED ORDER — BUPIVACAINE-EPINEPHRINE (PF) 0.25% -1:200000 IJ SOLN
INTRAMUSCULAR | Status: AC
  Filled 2021-02-14: qty 30

## 2021-02-14 MED ORDER — FENTANYL CITRATE (PF) 250 MCG/5ML IJ SOLN
INTRAMUSCULAR | Status: AC
  Filled 2021-02-14: qty 5

## 2021-02-14 MED ORDER — FENTANYL CITRATE (PF) 100 MCG/2ML IJ SOLN
25.0000 ug | INTRAMUSCULAR | Status: DC | PRN
  Administered 2021-02-14 (×3): 50 ug via INTRAVENOUS

## 2021-02-14 MED ORDER — DEXAMETHASONE SODIUM PHOSPHATE 10 MG/ML IJ SOLN
INTRAMUSCULAR | Status: DC | PRN
  Administered 2021-02-14: 10 mg via INTRAVENOUS

## 2021-02-14 MED ORDER — BUPIVACAINE-EPINEPHRINE 0.25% -1:200000 IJ SOLN
INTRAMUSCULAR | Status: DC | PRN
  Administered 2021-02-14: 10 mL

## 2021-02-14 MED ORDER — ONDANSETRON HCL 4 MG/2ML IJ SOLN
INTRAMUSCULAR | Status: AC
  Filled 2021-02-14: qty 2

## 2021-02-14 MED ORDER — PHENYLEPHRINE HCL-NACL 20-0.9 MG/250ML-% IV SOLN
INTRAVENOUS | Status: DC | PRN
  Administered 2021-02-14: 50 ug/min via INTRAVENOUS

## 2021-02-14 MED ORDER — GLYCOPYRROLATE PF 0.2 MG/ML IJ SOSY
PREFILLED_SYRINGE | INTRAMUSCULAR | Status: AC
  Filled 2021-02-14: qty 1

## 2021-02-14 MED ORDER — DEXAMETHASONE SODIUM PHOSPHATE 10 MG/ML IJ SOLN
INTRAMUSCULAR | Status: AC
  Filled 2021-02-14: qty 1

## 2021-02-14 MED ORDER — ROCURONIUM 10MG/ML (10ML) SYRINGE FOR MEDFUSION PUMP - OPTIME
INTRAVENOUS | Status: DC | PRN
  Administered 2021-02-14: 100 mg via INTRAVENOUS

## 2021-02-14 MED ORDER — SUCCINYLCHOLINE CHLORIDE 200 MG/10ML IV SOSY
PREFILLED_SYRINGE | INTRAVENOUS | Status: AC
  Filled 2021-02-14: qty 10

## 2021-02-14 MED ORDER — CEFAZOLIN SODIUM 1 G IJ SOLR
INTRAMUSCULAR | Status: AC
  Filled 2021-02-14: qty 20

## 2021-02-14 MED ORDER — LIDOCAINE HCL (CARDIAC) PF 100 MG/5ML IV SOSY
PREFILLED_SYRINGE | INTRAVENOUS | Status: DC | PRN
  Administered 2021-02-14: 100 mg via INTRATRACHEAL

## 2021-02-14 MED ORDER — LACTATED RINGERS IV SOLN: INTRAVENOUS | Status: DC | PRN

## 2021-02-14 MED ORDER — PHENYLEPHRINE 40 MCG/ML (10ML) SYRINGE FOR IV PUSH (FOR BLOOD PRESSURE SUPPORT)
PREFILLED_SYRINGE | INTRAVENOUS | Status: AC
  Filled 2021-02-14: qty 10

## 2021-02-14 MED ORDER — BUPIVACAINE LIPOSOME 1.3 % IJ SUSP
INTRAMUSCULAR | Status: AC
  Filled 2021-02-14: qty 20

## 2021-02-14 MED ORDER — ENSURE PRE-SURGERY PO LIQD
296.0000 mL | Freq: Once | ORAL | Status: AC
  Administered 2021-02-14: 296 mL via ORAL
  Filled 2021-02-14: qty 296

## 2021-02-14 MED ORDER — THROMBIN 20000 UNITS EX SOLR
CUTANEOUS | Status: AC
  Filled 2021-02-14: qty 20000

## 2021-02-14 MED ORDER — PROPOFOL 10 MG/ML IV BOLUS
INTRAVENOUS | Status: DC | PRN
  Administered 2021-02-14: 120 mg via INTRAVENOUS

## 2021-02-14 MED ORDER — MIDAZOLAM HCL 2 MG/2ML IJ SOLN
INTRAMUSCULAR | Status: AC
  Filled 2021-02-14: qty 2

## 2021-02-14 MED ORDER — 0.9 % SODIUM CHLORIDE (POUR BTL) OPTIME
TOPICAL | Status: DC | PRN
  Administered 2021-02-14: 1000 mL

## 2021-02-14 MED ORDER — PHENYLEPHRINE HCL (PRESSORS) 10 MG/ML IV SOLN
INTRAVENOUS | Status: DC | PRN
  Administered 2021-02-14: 80 ug via INTRAVENOUS

## 2021-02-14 SURGICAL SUPPLY — 74 items
BAG COUNTER SPONGE SURGICOUNT (BAG) ×2 IMPLANT
BENZOIN TINCTURE PRP APPL 2/3 (GAUZE/BANDAGES/DRESSINGS) ×1 IMPLANT
BLADE CLIPPER SURG (BLADE) ×1 IMPLANT
BUR PRESCISION 1.7 ELITE (BURR) ×2 IMPLANT
BUR ROUND PRECISION 4.0 (BURR) ×1 IMPLANT
BUR SABER RD CUTTING 3.0 (BURR) ×1 IMPLANT
CARTRIDGE OIL MAESTRO DRILL (MISCELLANEOUS) ×2 IMPLANT
CNTNR URN SCR LID CUP LEK RST (MISCELLANEOUS) ×1 IMPLANT
CONT SPEC 4OZ STRL OR WHT (MISCELLANEOUS) ×1
DIFFUSER DRILL AIR PNEUMATIC (MISCELLANEOUS) ×3 IMPLANT
DRAIN CHANNEL 15F RND FF W/TCR (WOUND CARE) ×1 IMPLANT
DRAPE C-ARM 42X72 X-RAY (DRAPES) ×2 IMPLANT
DRAPE ORTHO SPLIT 77X108 STRL (DRAPES) ×1
DRAPE SURG ORHT 6 SPLT 77X108 (DRAPES) ×1 IMPLANT
DRSG MEPILEX BORDER 4X12 (GAUZE/BANDAGES/DRESSINGS) IMPLANT
DRSG MEPILEX BORDER 4X8 (GAUZE/BANDAGES/DRESSINGS) IMPLANT
DURAPREP 26ML APPLICATOR (WOUND CARE) ×2 IMPLANT
ELECT CAUTERY BLADE 6.4 (BLADE) ×3 IMPLANT
ELECT REM PT RETURN 9FT ADLT (ELECTROSURGICAL) ×2
ELECTRODE REM PT RTRN 9FT ADLT (ELECTROSURGICAL) ×1 IMPLANT
EVACUATOR SILICONE 100CC (DRAIN) ×1 IMPLANT
GAUZE 4X4 16PLY ~~LOC~~+RFID DBL (SPONGE) ×5 IMPLANT
GAUZE SPONGE 4X4 12PLY STRL (GAUZE/BANDAGES/DRESSINGS) ×2 IMPLANT
GLOVE SRG 8 PF TXTR STRL LF DI (GLOVE) ×1 IMPLANT
GLOVE SURG ENC MOIS LTX SZ6.5 (GLOVE) ×3 IMPLANT
GLOVE SURG ENC MOIS LTX SZ8 (GLOVE) ×3 IMPLANT
GLOVE SURG UNDER POLY LF SZ8 (GLOVE) ×2
GOWN STRL REUS W/ TWL LRG LVL3 (GOWN DISPOSABLE) ×2 IMPLANT
GOWN STRL REUS W/ TWL XL LVL3 (GOWN DISPOSABLE) ×1 IMPLANT
GOWN STRL REUS W/TWL LRG LVL3 (GOWN DISPOSABLE) ×3
GOWN STRL REUS W/TWL XL LVL3 (GOWN DISPOSABLE) ×2
GUIDEWIRE SHARP VIPER II (WIRE) ×11 IMPLANT
IV CATH 14GX2 1/4 (CATHETERS) ×2 IMPLANT
KIT BASIN OR (CUSTOM PROCEDURE TRAY) ×2 IMPLANT
KIT POSITION SURG JACKSON T1 (MISCELLANEOUS) ×2 IMPLANT
KIT TURNOVER KIT B (KITS) ×2 IMPLANT
MARKER SKIN DUAL TIP RULER LAB (MISCELLANEOUS) ×2 IMPLANT
NDL HYPO 25GX1X1/2 BEV (NEEDLE) ×1 IMPLANT
NDL SPNL 18GX3.5 QUINCKE PK (NEEDLE) ×2 IMPLANT
NEEDLE HYPO 25GX1X1/2 BEV (NEEDLE) ×2 IMPLANT
NEEDLE SPNL 18GX3.5 QUINCKE PK (NEEDLE) ×4 IMPLANT
NS IRRIG 1000ML POUR BTL (IV SOLUTION) ×2 IMPLANT
OIL CARTRIDGE MAESTRO DRILL (MISCELLANEOUS) ×2
PACK LAMINECTOMY NEURO (CUSTOM PROCEDURE TRAY) ×1 IMPLANT
PACK UNIVERSAL I (CUSTOM PROCEDURE TRAY) ×2 IMPLANT
PUTTY DBX 2.5CC (Putty) ×2 IMPLANT
PUTTY DBX 2.5CC DEPUY (Putty) IMPLANT
ROD VIPER PREBENT 100MM (Rod) ×2 IMPLANT
SCREW SET SINGLE INNER MIS (Screw) ×8 IMPLANT
SCREW XTAB POLY VIPER  7X45 (Screw) ×8 IMPLANT
SCREW XTAB POLY VIPER 7X45 (Screw) IMPLANT
SPONGE INTESTINAL PEANUT (DISPOSABLE) ×3 IMPLANT
SPONGE SURGIFOAM ABS GEL 100 (HEMOSTASIS) ×1 IMPLANT
STRIP CLOSURE SKIN 1/2X4 (GAUZE/BANDAGES/DRESSINGS) ×1 IMPLANT
SURGIFLO W/THROMBIN 8M KIT (HEMOSTASIS) IMPLANT
SUT MNCRL AB 4-0 PS2 18 (SUTURE) ×4 IMPLANT
SUT PROLENE 6 0 C 1 24 (SUTURE) IMPLANT
SUT VIC AB 0 CT1 18XCR BRD 8 (SUTURE) ×2 IMPLANT
SUT VIC AB 0 CT1 8-18 (SUTURE) ×2
SUT VIC AB 1 CT1 18XCR BRD 8 (SUTURE) ×2 IMPLANT
SUT VIC AB 1 CT1 8-18 (SUTURE) ×2
SUT VIC AB 2-0 CT2 18 VCP726D (SUTURE) ×5 IMPLANT
SYR 20ML LL LF (SYRINGE) ×2 IMPLANT
SYR BULB IRRIG 60ML STRL (SYRINGE) ×2 IMPLANT
SYR CONTROL 10ML LL (SYRINGE) ×2 IMPLANT
SYR TB 1ML LUER SLIP (SYRINGE) ×2 IMPLANT
TAP CANN VIPER2 DL 6.0 (TAP) ×2 IMPLANT
TAP CANN VIPER2 DL 7.0 (TAP) ×2 IMPLANT
TAPE CLOTH SURG 4X10 WHT LF (GAUZE/BANDAGES/DRESSINGS) ×1 IMPLANT
TOWEL GREEN STERILE (TOWEL DISPOSABLE) ×2 IMPLANT
TOWEL GREEN STERILE FF (TOWEL DISPOSABLE) ×2 IMPLANT
TRAY FOLEY MTR SLVR 16FR STAT (SET/KITS/TRAYS/PACK) ×2 IMPLANT
WATER STERILE IRR 1000ML POUR (IV SOLUTION) ×2 IMPLANT
YANKAUER SUCT BULB TIP NO VENT (SUCTIONS) ×2 IMPLANT

## 2021-02-14 NOTE — Transfer of Care (Signed)
Immediate Anesthesia Transfer of Care Note  Patient: Casey Chase  Procedure(s) Performed: LUMBAR THREE- LUMBAR FOUR, LUMBAR FOUR - LUMBAR FIVE, LUMBAR FIVE - SACRAL ONE POSTERIOR SPINAL FUSION WITH INSTRUMENTATION AND ALLOGRAFT (Spine Lumbar)  Patient Location: PACU  Anesthesia Type:General  Level of Consciousness: oriented, drowsy and patient cooperative  Airway & Oxygen Therapy: Patient Spontanous Breathing and Patient connected to nasal cannula oxygen  Post-op Assessment: Report given to RN, Post -op Vital signs reviewed and stable and Patient moving all extremities X 4  Post vital signs: Reviewed and stable  Last Vitals:  Vitals Value Taken Time  BP 98/83 02/14/21 1051  Temp    Pulse 90 02/14/21 1054  Resp 18 02/14/21 1055  SpO2 99 % 02/14/21 1054  Vitals shown include unvalidated device data.  Last Pain:  Vitals:   02/14/21 0427  TempSrc:   PainSc: 4       Patients Stated Pain Goal: 3 (02/14/21 0344)  Complications: No notable events documented.

## 2021-02-14 NOTE — H&P (Signed)
Patient tolerated stage 1 of his procedure well. Will proceed with stage 2 as scheduled (L3-S1 PSF).

## 2021-02-14 NOTE — Anesthesia Procedure Notes (Signed)
Procedure Name: Intubation Date/Time: 02/14/2021 7:57 AM Performed by: Claris Che, CRNA Pre-anesthesia Checklist: Emergency Drugs available, Patient identified, Suction available, Patient being monitored and Timeout performed Patient Re-evaluated:Patient Re-evaluated prior to induction Oxygen Delivery Method: Circle system utilized Preoxygenation: Pre-oxygenation with 100% oxygen Induction Type: IV induction and Cricoid Pressure applied Ventilation: Mask ventilation without difficulty Laryngoscope Size: Mac and 4 Grade View: Grade I Tube type: Oral Tube size: 8.0 mm Number of attempts: 1 Airway Equipment and Method: Stylet Placement Confirmation: ETT inserted through vocal cords under direct vision and breath sounds checked- equal and bilateral Secured at: 24 cm Tube secured with: Tape Dental Injury: Teeth and Oropharynx as per pre-operative assessment

## 2021-02-14 NOTE — Progress Notes (Signed)
Patient  enroute to OR via bed for stage 2 surgery this am. Patient in no s/s of acute distress at the time of transfer. Report given to [facility] CRNA. Will follow up.

## 2021-02-14 NOTE — Anesthesia Postprocedure Evaluation (Signed)
Anesthesia Post Note  Patient: Casey Chase  Procedure(s) Performed: LUMBAR THREE- LUMBAR FOUR, LUMBAR FOUR - LUMBAR FIVE, LUMBAR FIVE - SACRAL ONE POSTERIOR SPINAL FUSION WITH INSTRUMENTATION AND ALLOGRAFT (Spine Lumbar)     Patient location during evaluation: PACU Anesthesia Type: General Level of consciousness: awake Pain management: pain level controlled Vital Signs Assessment: post-procedure vital signs reviewed and stable Respiratory status: spontaneous breathing Cardiovascular status: stable Postop Assessment: no apparent nausea or vomiting Anesthetic complications: no   No notable events documented.  Last Vitals:  Vitals:   02/14/21 1051 02/14/21 1105  BP: 98/83 134/70  Pulse: (!) 101 92  Resp: 14 17  Temp: 36.4 C   SpO2: 95% 95%    Last Pain:  Vitals:   02/14/21 1105  TempSrc:   PainSc: 10-Worst pain ever                 Lovelee Forner

## 2021-02-14 NOTE — Anesthesia Preprocedure Evaluation (Signed)
Anesthesia Evaluation  Patient identified by MRN, date of birth, ID band Patient awake    Reviewed: Allergy & Precautions, NPO status , Patient's Chart, lab work & pertinent test results  Airway Mallampati: II       Dental   Pulmonary former smoker,    breath sounds clear to auscultation       Cardiovascular negative cardio ROS   Rhythm:Regular Rate:Normal     Neuro/Psych    GI/Hepatic Neg liver ROS, hiatal hernia, GERD  ,  Endo/Other    Renal/GU negative Renal ROS     Musculoskeletal   Abdominal   Peds  Hematology   Anesthesia Other Findings   Reproductive/Obstetrics                             Anesthesia Physical Anesthesia Plan  ASA: 3  Anesthesia Plan: General   Post-op Pain Management:    Induction: Intravenous  PONV Risk Score and Plan: 2  Airway Management Planned: Oral ETT  Additional Equipment:   Intra-op Plan:   Post-operative Plan:   Informed Consent:   Plan Discussed with: CRNA and Anesthesiologist  Anesthesia Plan Comments:         Anesthesia Quick Evaluation

## 2021-02-14 NOTE — Op Note (Signed)
PATIENT NAME: Casey Chase   MEDICAL RECORD NO.:   [id number]    DATE OF BIRTH: [birth year]   DATE OF PROCEDURE: 02/14/2021                                OPERATIVE REPORT     PREOPERATIVE DIAGNOSES: 1.  Status post L5-S1 anterior lumbar fusion, and left-sided lateral fusion at L3-4 and L4-5 on 02/13/2021, requiring posterior fusion with instrumentation   POSTOPERATIVE DIAGNOSES:   1.  Status post L5-S1 anterior lumbar fusion, and left-sided lateral fusion at L3-4 and L4-5 on 02/13/2021, requiring posterior fusion with instrumentation     PROCEDURE (Stage 2): 1. Posterior spinal fusion, L3-4, L4-5, L5-S1. 2. Placement of posterior segmental instrumentation, L3, L4, L5, S1 bilaterally. 3. Use of morselized allograft - DBX-mix 4. Intraoperative use of floroscopy   SURGEON:  Estill Bamberg, MD   ASSISTANT:  Jason Coop, PA-C   ANESTHESIA:  General endotracheal anesthesia.   COMPLICATIONS:  None.   DISPOSITION:  Stable.   ESTIMATED BLOOD LOSS:  Minimal   INDICATIONS FOR SURGERY: Briefly, [name] is one day status post an anterior and lateral lumbar fusion as noted above.  Please refer to my operative report dated 02/13/2021, for a full account of the patient's preoperative history, diagnoses, and indications for surgery.  The patient did present today for stage 2 of what was to be a 2-staged procedure.   OPERATIVE DETAILS:  On 02/14/2021, the patient was brought to surgery and general endotracheal anesthesia was administered.  The patient was placed prone onto a Jackson spinal bed.  The back was then prepped and draped in the usual sterile fashion.  I then made paramedian incisions on the right and left sides, just lateral to the lateral borders of the pedicles of L3, L4, L5, and S1.  On the left side, the posterolateral gutter and posterior elements at the L3-4, L4-5 and L5-S1 levels were identified and exposed and decorticated.  DBX-mix was packed into the posterolateral  gutter on the left side to aid in the success of the posterior fusion.  I then tapped the L3, L4, L5, and S1 pedicles bilaterally using a 6 mm tap.  I then placed 7 x 45 mm screws bilaterally at S1, L3, L4 and L5.  Rods were then secured into the tulip heads of the screws bilaterally. Caps were then placed and a final locking procedure was performed.  I was very pleased with the final AP and lateral fluoroscopic images.  The wound was then copiously irrigated.  On the right and left sides, the fascia was closed using #1 Vicryl.  The subcutaneous layer was closed using 0 Vicryl followed by 2-0 Vicryl, and the skin was then closed using 4-0 Monocryl. Benzoin and Steri-Strips were applied followed by sterile dressing.  All instrument counts were correct at the termination of the procedure.   Of note, Jason Coop was my assistant throughout surgery, and did aid in retraction, suctioning, placement of the hardware, and closure for the entire procedure.     Estill Bamberg, MD

## 2021-02-15 ENCOUNTER — Encounter (HOSPITAL_COMMUNITY): Payer: Self-pay | Admitting: Orthopedic Surgery

## 2021-02-15 MED ORDER — METHOCARBAMOL 500 MG PO TABS: 500.0000 mg | ORAL_TABLET | Freq: Four times a day (QID) | ORAL | 0 refills | Status: AC | PRN

## 2021-02-15 MED ORDER — OXYCODONE-ACETAMINOPHEN 5-325 MG PO TABS: 1.0000 | ORAL_TABLET | ORAL | 0 refills | Status: AC | PRN

## 2021-02-15 MED FILL — Sodium Chloride IV Soln 0.9%: INTRAVENOUS | Qty: 1000 | Status: AC

## 2021-02-15 MED FILL — Heparin Sodium (Porcine) Inj 1000 Unit/ML: INTRAMUSCULAR | Qty: 30 | Status: AC

## 2021-02-15 NOTE — Evaluation (Signed)
Occupational Therapy Evaluation Patient Details Name: Casey Chase MRN: [id number] DOB: 1955-06-28 Today's Date: 02/15/2021   History of Present Illness 65 y.o. M admitted on 12/14 due to L3-S1  ALIF. PMH significant for Arthritis and GERD.   Clinical Impression   PT admitted for procedures listed above. PTA pt reported that he was independent with all ADL's and IADL's, including driving. At this time, pt requiring light assist for LB ADL's, which pt's wife is able to assist with. Pt requiring increased time for all tasks due to pain. He has no further OT needs and acute OT will sign off.      Recommendations for follow up therapy are one component of a multi-disciplinary discharge planning process, led by the attending physician.  Recommendations may be updated based on patient status, additional functional criteria and insurance authorization.   Follow Up Recommendations  No OT follow up    Assistance Recommended at Discharge Set up Supervision/Assistance  Functional Status Assessment  Patient has had a recent decline in their functional status and demonstrates the ability to make significant improvements in function in a reasonable and predictable amount of time.  Equipment Recommendations  Other (comment) (RW)    Recommendations for Other Services       Precautions / Restrictions Precautions Precautions: Back Precaution Booklet Issued: Yes (comment) Precaution Comments: Reviewed precautions and compensatory strategies. Required Braces or Orthoses: Spinal Brace Spinal Brace: Thoracolumbosacral orthotic Restrictions Weight Bearing Restrictions: No      Mobility Bed Mobility Overal bed mobility: Modified Independent             General bed mobility comments: completeing log roll with no difficulties.    Transfers Overall transfer level: Modified independent Equipment used: Rolling walker (2 wheels)               General transfer comment: Education on safe  RW usage.      Balance Overall balance assessment: Mild deficits observed, not formally tested                                         ADL either performed or assessed with clinical judgement   ADL Overall ADL's : Modified independent                                       General ADL Comments: Pt completing ADL's with light assist for LB ADL's. Educated on compensatory strateiges. Pt's wife able to assist as needed.     Vision Baseline Vision/History: 0 No visual deficits Ability to See in Adequate Light: 0 Adequate Patient Visual Report: No change from baseline Vision Assessment?: No apparent visual deficits     Perception     Praxis      Pertinent Vitals/Pain Pain Assessment: Faces Faces Pain Scale: Hurts even more Pain Location: Back Pain Descriptors / Indicators: Aching;Discomfort;Grimacing Pain Intervention(s): Monitored during session;Repositioned     Hand Dominance Right   Extremity/Trunk Assessment Upper Extremity Assessment Upper Extremity Assessment: Overall WFL for tasks assessed   Lower Extremity Assessment Lower Extremity Assessment: Defer to PT evaluation   Cervical / Trunk Assessment Cervical / Trunk Assessment: Back Surgery   Communication Communication Communication: No difficulties   Cognition Arousal/Alertness: Awake/alert Behavior During Therapy: WFL for tasks assessed/performed Overall Cognitive Status: Within Functional Limits for tasks assessed  General Comments  VSS on RA, dressing clean and dry    Exercises     Shoulder Instructions      Home Living Family/patient expects to be discharged to:: Private residence Living Arrangements: Spouse/significant other Available Help at Discharge: Family;Available 24 hours/day Type of Home: House Home Access: Stairs to enter [employer] of Steps: 2 steps Entrance Stairs-Rails: None Home  Layout: One level     Bathroom Shower/Tub: Chief Strategy Officer: Handicapped height     Home Equipment: Cane - single point          Prior Functioning/Environment Prior Level of Function : Independent/Modified Independent;Driving             Mobility Comments: Indep, uses a cane intermittently ADLs Comments: indep        OT Problem List: Decreased strength;Decreased activity tolerance;Impaired balance (sitting and/or standing);Decreased knowledge of use of DME or AE;Impaired UE functional use;Pain      OT Treatment/Interventions:      OT Goals(Current goals can be found in the care plan section) Acute Rehab OT Goals Patient Stated Goal: To go home OT Goal Formulation: With patient Time For Goal Achievement: 02/15/21 Potential to Achieve Goals: Good  OT Frequency:     Barriers to D/C:            Co-evaluation              AM-PAC OT "6 Clicks" Daily Activity     Outcome Measure Help from another person eating meals?: None Help from another person taking care of personal grooming?: None Help from another person toileting, which includes using toliet, bedpan, or urinal?: A Little Help from another person bathing (including washing, rinsing, drying)?: A Little Help from another person to put on and taking off regular upper body clothing?: None Help from another person to put on and taking off regular lower body clothing?: A Little 6 Click Score: 21   End of Session Equipment Utilized During Treatment: Rolling walker (2 wheels);Back brace Nurse Communication: Mobility status  Activity Tolerance: Patient tolerated treatment well Patient left: in bed;with call bell/phone within reach;with family/visitor present  OT Visit Diagnosis: Unsteadiness on feet (R26.81);Other abnormalities of gait and mobility (R26.89);Muscle weakness (generalized) (M62.81)                Time: 1020-1049 OT Time Calculation (min): 29 min Charges:  OT General  Charges $OT Visit: 1 Visit OT Evaluation $OT Eval Low Complexity: 1 Low OT Treatments $Self Care/Home Management : 8-22 mins  Casey Mabie H., Casey Chase Acute Rehabilitation  Eagan Shifflett Elane Rondarius Kadrmas 02/15/2021, 11:03 AM

## 2021-02-15 NOTE — Progress Notes (Signed)
Patient awaiting transport via wheelchair by NT for discharge home; in no acute distress nor complaints of pain nor discomfort; incision on his back, right flank and abdomen with gauze dressings and were clean, dry and intact; room was checked and accounted for all his belongings; discharge instructions concerning his medications, incision care, follow up appointment and when to call the doctor were all discussed with patient by RN and his wife and they both expressed understanding on the instructions given.

## 2021-02-15 NOTE — Plan of Care (Signed)

## 2021-02-15 NOTE — Evaluation (Signed)
Physical Therapy Evaluation Patient Details Name: Casey Chase MRN: [id number] DOB: Apr 20, 1955 Today's Date: 02/15/2021  History of Present Illness  Pt is a 65 y/o male who presents s/p 2 stage lumbar fusion. Pt underwent L3-L5 ALIF on 12/14 and L3-S1 PLIF on 12/15. PMH significant for R THR in 2017.   Clinical Impression  Pt admitted with above diagnosis. At the time of PT eval, pt was able to demonstrate transfers and ambulation with gross min guard assist to supervision for safety and RW for support. Pt was educated on precautions, brace application/wearing schedule, appropriate activity progression, and car transfer. Pt currently with functional limitations due to the deficits listed below (see PT Problem List). Pt will benefit from skilled PT to increase their independence and safety with mobility to allow discharge to the venue listed below.         Recommendations for follow up therapy are one component of a multi-disciplinary discharge planning process, led by the attending physician.  Recommendations may be updated based on patient status, additional functional criteria and insurance authorization.  Follow Up Recommendations No PT follow up    Assistance Recommended at Discharge PRN  Functional Status Assessment Patient has had a recent decline in their functional status and demonstrates the ability to make significant improvements in function in a reasonable and predictable amount of time.  Equipment Recommendations  Rolling walker (2 wheels)    Recommendations for Other Services       Precautions / Restrictions Precautions Precautions: Back Precaution Booklet Issued: Yes (comment) Precaution Comments: Reviewed precautions and compensatory strategies. Required Braces or Orthoses: Spinal Brace Spinal Brace: Thoracolumbosacral orthotic Restrictions Weight Bearing Restrictions: No      Mobility  Bed Mobility Overal bed mobility:              General bed mobility  comments: Pt was received sitting up EOB    Transfers Overall transfer level: Needs assistance Equipment used: Rolling walker (2 wheels) Transfers: Sit to/from Stand Sit to Stand: Supervision           General transfer comment: VC's for hand placement on seated surface for safety. Bed height elevated for ease of transfer and to siulate home environment.    Ambulation/Gait Ambulation/Gait assistance: Supervision Gait Distance (Feet): 300 Feet Assistive device: Rolling walker (2 wheels) Gait Pattern/deviations: Step-through pattern;Decreased stride length;Decreased dorsiflexion - right;Decreased dorsiflexion - left;Decreased weight shift to right Gait velocity: Decreased Gait velocity interpretation: <1.31 ft/sec, indicative of household ambulator   General Gait Details: Slow pace with flexed trunk and difficulty with floor clearance. Able to improve with VC's however unable to maintain.  Stairs Stairs: Yes Stairs assistance: Min guard;Min assist Stair Management: No rails;Forwards;Step to pattern Number of Stairs: 2 General stair comments: VC's for sequencing and general safety. Pt's wife present and was instructed on safe guarding for pt.  Wheelchair Mobility    Modified Rankin (Stroke Patients Only)       Balance Overall balance assessment: Needs assistance Sitting-balance support: Feet supported;No upper extremity supported Sitting balance-Leahy Scale: Fair     Standing balance support: Bilateral upper extremity supported;During functional activity;Reliant on assistive device for balance Standing balance-Leahy Scale: Poor                               Pertinent Vitals/Pain Pain Assessment: Faces Faces Pain Scale: Hurts even more Pain Location: Back Pain Descriptors / Indicators: Aching;Discomfort;Grimacing;Operative site guarding Pain Intervention(s): Limited activity within patient's tolerance;Monitored  during session;Repositioned    Home  Living Family/patient expects to be discharged to:: Private residence Living Arrangements: Spouse/significant other Available Help at Discharge: Family;Available 24 hours/day Type of Home: House Home Access: Stairs to enter Entrance Stairs-Rails: None Entrance Stairs-Number of Steps: 2 steps   Home Layout: One level Home Equipment: Cane - single point      Prior Function Prior Level of Function : Independent/Modified Independent;Driving             Mobility Comments: Indep, uses a cane intermittently ADLs Comments: indep     Hand Dominance   Dominant Hand: Right    Extremity/Trunk Assessment   Upper Extremity Assessment Upper Extremity Assessment: Defer to OT evaluation    Lower Extremity Assessment Lower Extremity Assessment: Generalized weakness (Consistent with pre-op diagnosis)    Cervical / Trunk Assessment Cervical / Trunk Assessment: Back Surgery  Communication   Communication: No difficulties  Cognition Arousal/Alertness: Awake/alert Behavior During Therapy: WFL for tasks assessed/performed Overall Cognitive Status: Within Functional Limits for tasks assessed                                          General Comments General comments (skin integrity, edema, etc.): VSS on RA, dressing clean and dry    Exercises     Assessment/Plan    PT Assessment Patient needs continued PT services  PT Problem List Decreased strength;Decreased activity tolerance;Decreased balance;Decreased mobility;Decreased knowledge of use of DME;Decreased safety awareness;Decreased knowledge of precautions;Pain       PT Treatment Interventions DME instruction;Gait training;Stair training;Functional mobility training;Therapeutic activities;Therapeutic exercise;Neuromuscular re-education;Patient/family education    PT Goals (Current goals can be found in the Care Plan section)  Acute Rehab PT Goals Patient Stated Goal: Home today PT Goal Formulation: With  patient/family Time For Goal Achievement: 02/22/21 Potential to Achieve Goals: Good    Frequency Min 5X/week   Barriers to discharge        Co-evaluation               AM-PAC PT "6 Clicks" Mobility  Outcome Measure Help needed turning from your back to your side while in a flat bed without using bedrails?: A Little Help needed moving from lying on your back to sitting on the side of a flat bed without using bedrails?: A Little Help needed moving to and from a bed to a chair (including a wheelchair)?: A Little Help needed standing up from a chair using your arms (e.g., wheelchair or bedside chair)?: A Little Help needed to walk in hospital room?: A Little Help needed climbing 3-5 steps with a railing? : A Little 6 Click Score: 18    End of Session Equipment Utilized During Treatment: Gait belt;Back brace Activity Tolerance: Patient tolerated treatment well Patient left: in bed;with call bell/phone within reach;with family/visitor present Nurse Communication: Mobility status PT Visit Diagnosis: Unsteadiness on feet (R26.81);Pain Pain - part of body:  (back)    Time: 6160-7371 PT Time Calculation (min) (ACUTE ONLY): 24 min   Charges:   PT Evaluation $PT Eval Low Complexity: 1 Low PT Treatments $Gait Training: 8-22 mins        Conni Slipper, PT, DPT Acute Rehabilitation Services Pager: [phone] Office: [phone]   Marylynn Pearson 02/15/2021, 1:02 PM

## 2021-02-15 NOTE — Progress Notes (Signed)
° ° °  Subjective  - POD #2, s/p anterior exposure, L5-S1  C/o abd soreness Passing flatus   Physical Exam:  Abd soft Dressing clean and dry Palpable DP pulses      Assessment/Plan:  POD #2  Stable from anterior exposure perspective Will be available as needed  Casey Chase 02/15/2021 8:07 AM --  Vitals:   02/15/21 0353 02/15/21 0727  BP: 132/77 (!) 145/66  Pulse: 78 91  Resp: 18 20  Temp: 98.4 F (36.9 C) 98.9 F (37.2 C)  SpO2: 99% 96%    Intake/Output Summary (Last 24 hours) at 02/15/2021 0807 Last data filed at 02/14/2021 1052 Gross per 24 hour  Intake 1516 ml  Output 250 ml  Net 1266 ml     Laboratory CBC    Component Value Date/Time   WBC 6.1 02/11/2021 1131   HGB 15.0 02/11/2021 1131   HCT 45.5 02/11/2021 1131   PLT 299 02/11/2021 1131    BMET    Component Value Date/Time   NA 141 02/11/2021 1131   K 3.9 02/11/2021 1131   CL 107 02/11/2021 1131   CO2 27 02/11/2021 1131   GLUCOSE 107 (H) 02/11/2021 1131   BUN 12 02/11/2021 1131   CREATININE 0.91 02/11/2021 1131   CALCIUM 9.3 02/11/2021 1131   GFRNONAA >60 02/11/2021 1131   GFRAA >60 12/05/2015 0431    COAG Lab Results  Component Value Date   INR 1.0 02/11/2021   No results found for: PTT  Antibiotics Anti-infectives (From admission, onward)    Start     Dose/Rate Route Frequency Ordered Stop   02/13/21 2100  ceFAZolin (ANCEF) IVPB 2g/100 mL premix        2 g 200 mL/hr over 30 Minutes Intravenous Every 8 hours 02/13/21 1616 02/14/21 0413   02/13/21 0630  ceFAZolin (ANCEF) IVPB 2g/100 mL premix        2 g 200 mL/hr over 30 Minutes Intravenous On call to O.R. 02/13/21 2836 02/13/21 1315        V. Charlena Cross, M.D., [facility] [place] Office: [phone] Pager:  [phone]

## 2021-02-15 NOTE — Progress Notes (Signed)
° ° °  Patient doing well status post staged anterior/lateral fusion and posterior fusion spanning L3-S1.  He is doing overall quite well and denies any leg pain today.  He has back pain and tension but well-controlled.  He has been up to the bathroom but has not yet been ambulating the halls or up with therapy.  He had several questions about his brace but otherwise is doing well.  Good appetite.  Positive passing gas and no difficulty with urination.patient has not yet been up with therapy  Physical Exam: Vitals:   02/15/21 0353 02/15/21 0727  BP: 132/77 (!) 145/66  Pulse: 78 91  Resp: 18 20  Temp: 98.4 F (36.9 C) 98.9 F (37.2 C)  SpO2: 99% 96%    Dressings in place, CDI ANT/LAT/POST, patient resting comfortably in bed with wife at bedside, NVI  POD #2/1 s/p staged anterior/lateral and posterior fusion spanning L3-S1. Resolved leg symptoms, expected postoperative low back discomfort, well-controlled.  Doing well.  Pending physical therapy  - up with PT/OT, encourage ambulation - Percocet for pain, Robaxin for muscle spasms - d/c home today with f/u in 2 weeks, pending physical therapy clearance for discharge

## 2021-02-16 ENCOUNTER — Encounter (HOSPITAL_COMMUNITY): Payer: Self-pay | Admitting: Orthopedic Surgery

## 2021-02-21 NOTE — Discharge Summary (Signed)
Patient ID: Casey Chase MRN: [id number] DOB/AGE: 1955-04-05 65 y.o.  Admit date: 02/13/2021 Discharge date: 02/15/2021  Admission Diagnoses:  Principal Problem:   Radiculopathy   Discharge Diagnoses:  Same  Past Medical History:  Diagnosis Date   Arthritis    Back pain    GERD (gastroesophageal reflux disease)    History of hiatal hernia     Surgeries: Procedure(s): LUMBAR THREE- LUMBAR FOUR, LUMBAR FOUR - LUMBAR FIVE, LUMBAR FIVE - SACRAL ONE POSTERIOR SPINAL FUSION WITH INSTRUMENTATION AND ALLOGRAFT on 02/14/2021   Consultants: Treatment Team:  Nada Libman, MD  Discharged Condition: Improved  Hospital Course: Casey Chase is an 65 y.o. male who was admitted 02/13/2021 for operative treatment of Radiculopathy. Patient has severe unremitting pain that affects sleep, daily activities, and work/hobbies. After pre-op clearance the patient was taken to the operating room on 02/14/2021 and underwent  Procedure(s): LUMBAR THREE- LUMBAR FOUR, LUMBAR FOUR - LUMBAR FIVE, LUMBAR FIVE - SACRAL ONE POSTERIOR SPINAL FUSION WITH INSTRUMENTATION AND ALLOGRAFT.    Patient was given perioperative antibiotics:  Anti-infectives (From admission, onward)    Start     Dose/Rate Route Frequency Ordered Stop   02/13/21 2100  ceFAZolin (ANCEF) IVPB 2g/100 mL premix        2 g 200 mL/hr over 30 Minutes Intravenous Every 8 hours 02/13/21 1616 02/14/21 0413   02/13/21 0630  ceFAZolin (ANCEF) IVPB 2g/100 mL premix        2 g 200 mL/hr over 30 Minutes Intravenous On call to O.R. 02/13/21 0619 02/13/21 1315        Patient was given sequential compression devices, early ambulation to prevent DVT.  Patient benefited maximally from hospital stay and there were no complications.    Recent vital signs: BP (!) 145/66 (BP Location: Left Arm)    Pulse 91    Temp 98.9 F (37.2 C) (Oral)    Resp 20    Ht 5\' 7"  (1.702 m)    Wt 95.3 kg    SpO2 96%    BMI 32.89 kg/m    Discharge  Medications:   Allergies as of 02/15/2021   No Known Allergies      Medication List     TAKE these medications    atorvastatin 10 MG tablet Commonly known as: LIPITOR Take 10 mg by mouth daily.   methocarbamol 500 MG tablet Commonly known as: ROBAXIN Take 1 tablet (500 mg total) by mouth every 6 (six) hours as needed for muscle spasms.   oxyCODONE-acetaminophen 5-325 MG tablet Commonly known as: PERCOCET/ROXICET Take 1 tablet by mouth every 4 (four) hours as needed for severe pain.        Diagnostic Studies: DG Lumbar Spine 2-3 Views  Result Date: 02/14/2021 CLINICAL DATA:  Surgical posterior fusion extending from L3-S1. EXAM: LUMBAR SPINE - 2-3 VIEW; DG C-ARM 1-60 MIN-NO REPORT Radiation exposure index: 285.63 mGy. COMPARISON:  February 13, 2021. FINDINGS: Two intraoperative fluoroscopic images were obtained of the lumbar spine. These demonstrate the patient be status post surgical posterior fusion of L3-4, L4-5 and L5-S1 with bilateral intrapedicular screw placement. Interbody fusion is noted in the intervening levels. IMPRESSION: Fluoroscopic guidance provided during lower lumbar surgery. Electronically Signed   By: February 15, 2021 M.D.   On: 02/14/2021 12:41   DG Lumbar Spine 2-3 Views  Result Date: 02/13/2021 CLINICAL DATA:  Surgical fusion of lower lumbar spine. EXAM: LUMBAR SPINE - 2-3 VIEW; DG C-ARM 1-60 MIN-NO REPORT Radiation exposure index: 249.59 mGy.  COMPARISON:  None. FINDINGS: Two intraoperative fluoroscopic images were obtained of the lower lumbar spine. These images demonstrate the patient be status post surgical fusion of L3-4, L4-5 and L5-S1. IMPRESSION: Fluoroscopic guidance provided during lower lumbar surgery. Electronically Signed   By: Lupita Raider M.D.   On: 02/13/2021 15:58   DG C-Arm 1-60 Min-No Report  Result Date: 02/14/2021 CLINICAL DATA:  Surgical posterior fusion extending from L3-S1. EXAM: LUMBAR SPINE - 2-3 VIEW; DG C-ARM 1-60 MIN-NO REPORT  Radiation exposure index: 285.63 mGy. COMPARISON:  February 13, 2021. FINDINGS: Two intraoperative fluoroscopic images were obtained of the lumbar spine. These demonstrate the patient be status post surgical posterior fusion of L3-4, L4-5 and L5-S1 with bilateral intrapedicular screw placement. Interbody fusion is noted in the intervening levels. IMPRESSION: Fluoroscopic guidance provided during lower lumbar surgery. Electronically Signed   By: Lupita Raider M.D.   On: 02/14/2021 12:41   DG C-Arm 1-60 Min-No Report  Result Date: 02/14/2021 CLINICAL DATA:  Surgical posterior fusion extending from L3-S1. EXAM: LUMBAR SPINE - 2-3 VIEW; DG C-ARM 1-60 MIN-NO REPORT Radiation exposure index: 285.63 mGy. COMPARISON:  February 13, 2021. FINDINGS: Two intraoperative fluoroscopic images were obtained of the lumbar spine. These demonstrate the patient be status post surgical posterior fusion of L3-4, L4-5 and L5-S1 with bilateral intrapedicular screw placement. Interbody fusion is noted in the intervening levels. IMPRESSION: Fluoroscopic guidance provided during lower lumbar surgery. Electronically Signed   By: Lupita Raider M.D.   On: 02/14/2021 12:41   DG C-Arm 1-60 Min-No Report  Result Date: 02/13/2021 CLINICAL DATA:  Surgical fusion of lower lumbar spine. EXAM: LUMBAR SPINE - 2-3 VIEW; DG C-ARM 1-60 MIN-NO REPORT Radiation exposure index: 249.59 mGy. COMPARISON:  None. FINDINGS: Two intraoperative fluoroscopic images were obtained of the lower lumbar spine. These images demonstrate the patient be status post surgical fusion of L3-4, L4-5 and L5-S1. IMPRESSION: Fluoroscopic guidance provided during lower lumbar surgery. Electronically Signed   By: Lupita Raider M.D.   On: 02/13/2021 15:58   DG C-Arm 1-60 Min-No Report  Result Date: 02/13/2021 CLINICAL DATA:  Surgical fusion of lower lumbar spine. EXAM: LUMBAR SPINE - 2-3 VIEW; DG C-ARM 1-60 MIN-NO REPORT Radiation exposure index: 249.59 mGy.  COMPARISON:  None. FINDINGS: Two intraoperative fluoroscopic images were obtained of the lower lumbar spine. These images demonstrate the patient be status post surgical fusion of L3-4, L4-5 and L5-S1. IMPRESSION: Fluoroscopic guidance provided during lower lumbar surgery. Electronically Signed   By: Lupita Raider M.D.   On: 02/13/2021 15:58   DG C-Arm 1-60 Min-No Report  Result Date: 02/13/2021 Fluoroscopy was utilized by the requesting physician.  No radiographic interpretation.   DG C-Arm 1-60 Min-No Report  Result Date: 02/13/2021 Fluoroscopy was utilized by the requesting physician.  No radiographic interpretation.   DG C-Arm 1-60 Min-No Report  Result Date: 02/13/2021 Fluoroscopy was utilized by the requesting physician.  No radiographic interpretation.   DG C-Arm 1-60 Min-No Report  Result Date: 02/13/2021 Fluoroscopy was utilized by the requesting physician.  No radiographic interpretation.   DG OR LOCAL ABDOMEN  Result Date: 02/13/2021 CLINICAL DATA:  Postop L5-S1 ALIF.  Evaluate for foreign body. EXAM: OR LOCAL ABDOMEN COMPARISON:  Pelvic and right hip radiographs 12/04/2015. FINDINGS: AP view of the abdomen demonstrates in anterior plate at E0-P2 and possible small vascular clips overlying the sacrum. Patient is status post right total hip arthroplasty. No unexpected foreign bodies are identified. The visualized bowel gas pattern is  normal. There is multilevel spondylosis. IMPRESSION: No unexpected foreign bodies identified post L5-S1 ALIF. These results were called by telephone at the time of interpretation on 02/13/2021 at 11:22 am to representative of provider MARK DUMONSKI in the operating room, who verbally acknowledged these results. Electronically Signed   By: Carey Bullocks M.D.   On: 02/13/2021 11:24    Disposition: Discharge disposition: 01-Home or Self Care       Discharge Instructions     Discharge patient   Complete by: As directed    Post therapy    Discharge disposition: 01-Home or Self Care   Discharge patient date: 02/15/2021      POD #2/1 s/p staged anterior/lateral and posterior fusion spanning L3-S1. Resolved leg symptoms, expected postoperative low back discomfort, well-controlled.  Doing well.    - up with PT/OT, encourage ambulation - Percocet for pain, Robaxin for muscle spasms -Scripts for pain sent to pharmacy electronically  -D/C instructions sheet printed and in chart -D/C today  -F/U in office 2 weeks   Signed: Georga Bora 02/21/2021, 9:55 AM

## 2023-10-14 IMAGING — RF DG LUMBAR SPINE 2-3V
1 series · 2 of 2 positions shown · non-contrast
Comparison: None.

CLINICAL DATA: Surgical fusion of lower lumbar spine.

EXAM:
LUMBAR SPINE - 2-3 VIEW; DG C-ARM 1-60 MIN-NO REPORT
Radiation exposure index: 249.59 mGy.

[Series 1: run · 2 of 2 slices shown]
[im 1/2]
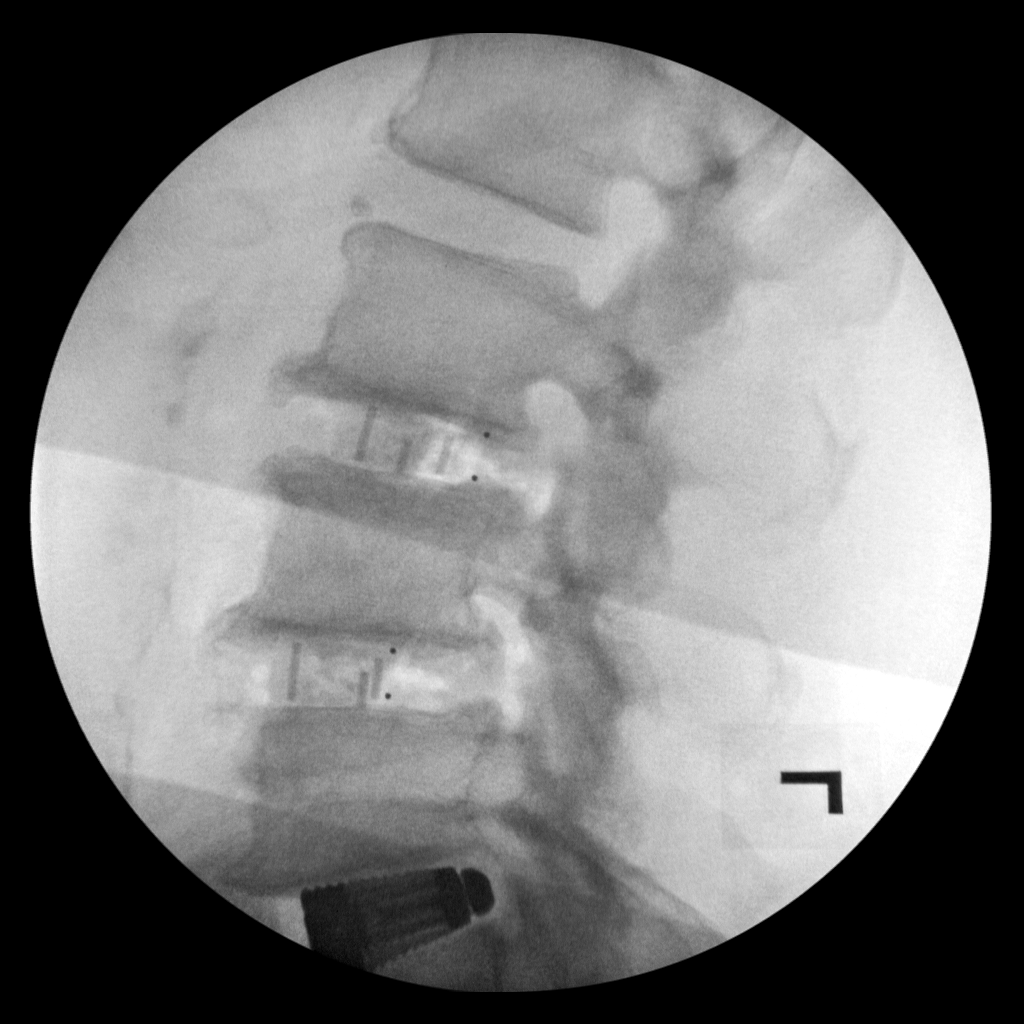
[im 2/2]
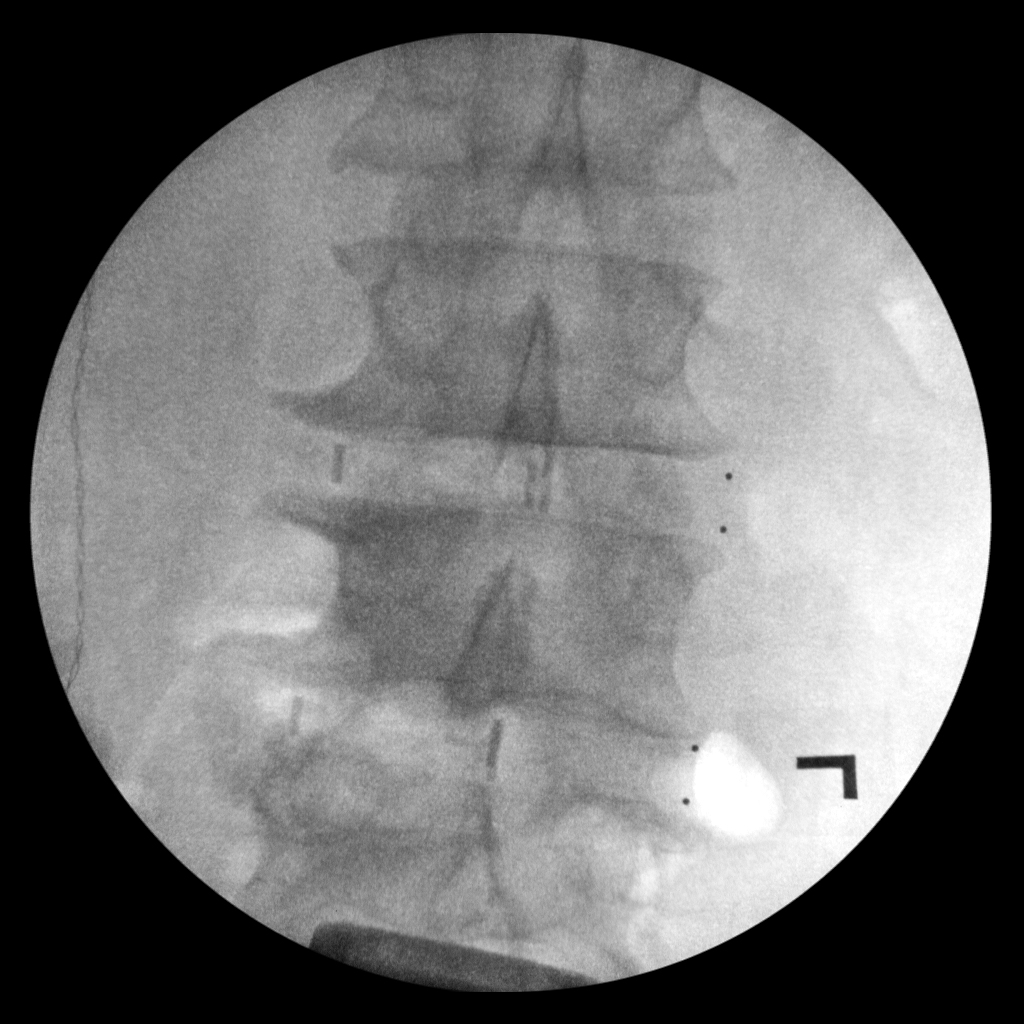

[2 of 2 positions shown; findings below may reference images not displayed]

FINDINGS: Two intraoperative fluoroscopic images were obtained of the lower
lumbar spine. These images demonstrate the patient be status post
surgical fusion of L3-4, L4-5 and L5-S1.
IMPRESSION: Fluoroscopic guidance provided during lower lumbar surgery.

## 2023-10-15 IMAGING — RF DG LUMBAR SPINE 2-3V
1 series · 2 of 2 positions shown · non-contrast
Comparison: February 13, 2021.

CLINICAL DATA: Surgical posterior fusion extending from L3-S1.

EXAM:
LUMBAR SPINE - 2-3 VIEW; DG C-ARM 1-60 MIN-NO REPORT
Radiation exposure index: 285.63 mGy.

[Series 1: run · 2 of 2 slices shown]
[im 1/2]
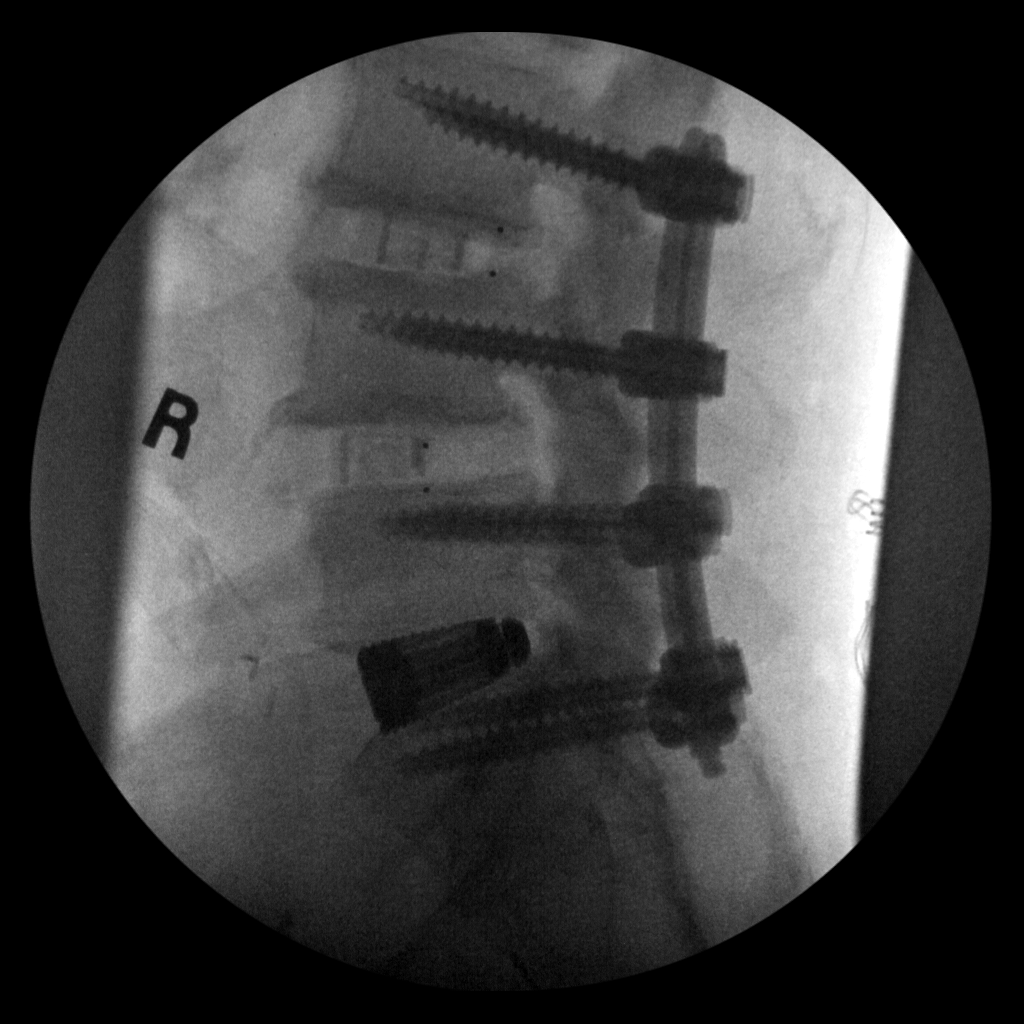
[im 2/2]
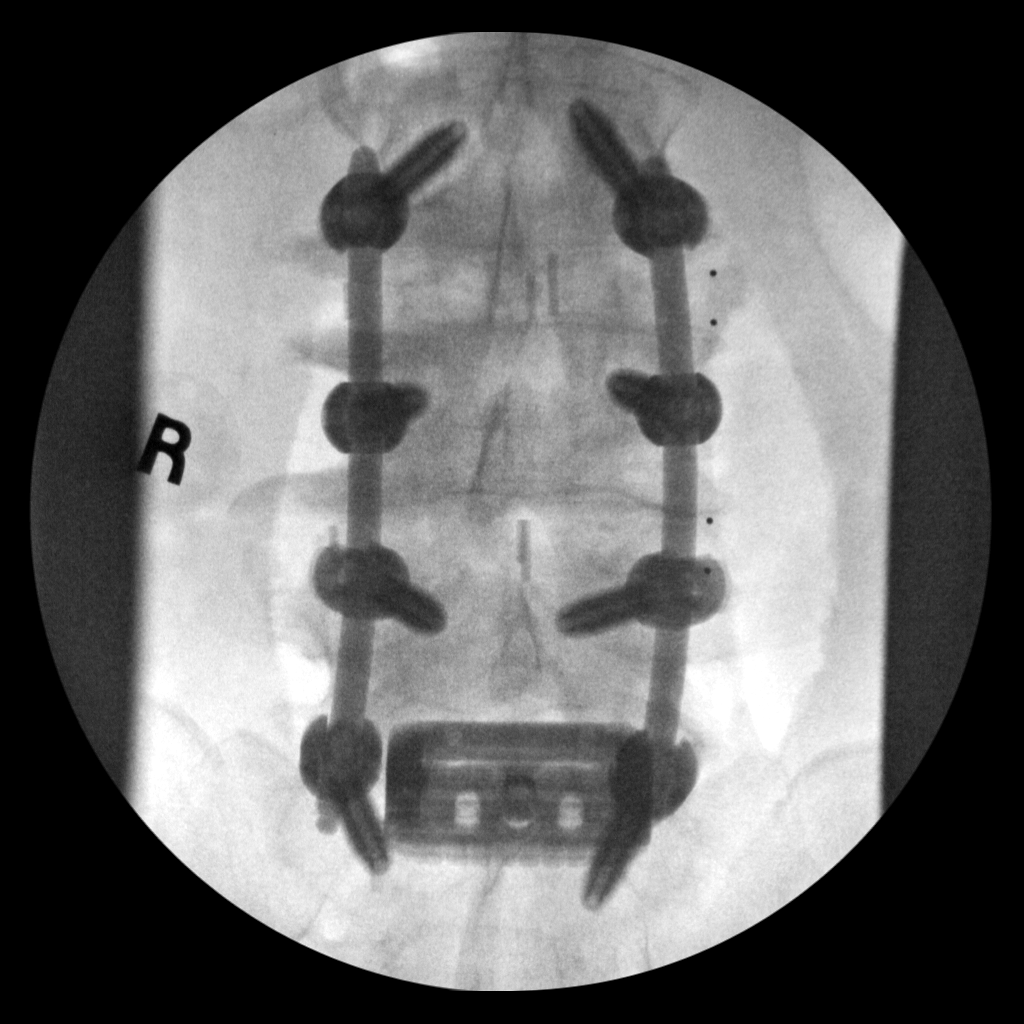

[2 of 2 positions shown; findings below may reference images not displayed]

FINDINGS: Two intraoperative fluoroscopic images were obtained of the lumbar
spine. These demonstrate the patient be status post surgical
posterior fusion of L3-4, L4-5 and L5-S1 with bilateral
intrapedicular screw placement. Interbody fusion is noted in the
intervening levels.
IMPRESSION: Fluoroscopic guidance provided during lower lumbar surgery.
